# Patient Record
Sex: Female | Born: 2000 | Race: Black or African American | Hispanic: No | Marital: Single | Smoking: Current every day smoker
Health system: Southern US, Community
[De-identification: ages and names within clinical notes are randomized; demographics above are authoritative.]

## PROBLEM LIST (undated history)

## (undated) HISTORY — PX: WISDOM TOOTH EXTRACTION: SHX21

## (undated) HISTORY — PX: NO PAST SURGERIES: SHX2092

---
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
## 2019-04-03 DIAGNOSIS — Z5181 Encounter for therapeutic drug level monitoring: Secondary | ICD-10-CM | POA: Diagnosis not present

## 2019-04-09 ENCOUNTER — Ambulatory Visit: Payer: Medicaid Other

## 2019-04-11 DIAGNOSIS — Z5181 Encounter for therapeutic drug level monitoring: Secondary | ICD-10-CM | POA: Diagnosis not present

## 2019-04-19 DIAGNOSIS — Z5181 Encounter for therapeutic drug level monitoring: Secondary | ICD-10-CM | POA: Diagnosis not present

## 2019-04-26 DIAGNOSIS — Z5181 Encounter for therapeutic drug level monitoring: Secondary | ICD-10-CM | POA: Diagnosis not present

## 2019-05-02 DIAGNOSIS — Z5181 Encounter for therapeutic drug level monitoring: Secondary | ICD-10-CM | POA: Diagnosis not present

## 2019-05-08 ENCOUNTER — Ambulatory Visit: Payer: Medicaid Other

## 2019-05-17 DIAGNOSIS — Z5181 Encounter for therapeutic drug level monitoring: Secondary | ICD-10-CM | POA: Diagnosis not present

## 2019-05-22 DIAGNOSIS — Z5181 Encounter for therapeutic drug level monitoring: Secondary | ICD-10-CM | POA: Diagnosis not present

## 2019-05-29 DIAGNOSIS — Z5181 Encounter for therapeutic drug level monitoring: Secondary | ICD-10-CM | POA: Diagnosis not present

## 2019-06-12 DIAGNOSIS — Z5181 Encounter for therapeutic drug level monitoring: Secondary | ICD-10-CM | POA: Diagnosis not present

## 2019-06-19 DIAGNOSIS — Z5181 Encounter for therapeutic drug level monitoring: Secondary | ICD-10-CM | POA: Diagnosis not present

## 2019-07-04 ENCOUNTER — Other Ambulatory Visit: Payer: Self-pay

## 2019-07-04 ENCOUNTER — Ambulatory Visit: Payer: Medicaid Other | Admitting: Physician Assistant

## 2019-07-04 DIAGNOSIS — Z0389 Encounter for observation for other suspected diseases and conditions ruled out: Secondary | ICD-10-CM | POA: Diagnosis not present

## 2019-07-04 DIAGNOSIS — Z113 Encounter for screening for infections with a predominantly sexual mode of transmission: Secondary | ICD-10-CM

## 2019-07-04 DIAGNOSIS — Z1388 Encounter for screening for disorder due to exposure to contaminants: Secondary | ICD-10-CM | POA: Diagnosis not present

## 2019-07-04 DIAGNOSIS — Z3009 Encounter for other general counseling and advice on contraception: Secondary | ICD-10-CM | POA: Diagnosis not present

## 2019-07-04 LAB — WET PREP FOR TRICH, YEAST, CLUE
Trichomonas Exam: NEGATIVE
Yeast Exam: NEGATIVE

## 2019-07-05 ENCOUNTER — Encounter: Payer: Self-pay | Admitting: Physician Assistant

## 2019-07-05 NOTE — Progress Notes (Signed)
  [facility] Department STI clinic/screening visit  Subjective:  Michele Nichols is a 19 y.o. female being seen today for an STI screening visit. The patient reports they do not have symptoms.  Patient reports that they do not desire a pregnancy in the next year.   They reported they are not interested in discussing contraception today.  No LMP recorded.   Patient has the following medical conditions:  There are no problems to display for this patient.   Chief Complaint  Patient presents with  . SEXUALLY TRANSMITTED DISEASE    HPI  Patient reports that she does not have any symptoms today but would like a screening.  States that she recently had a Nexplanon removed and has a history of irregular periods.  LMP 06/10/2019 and ranged from spotting to heavy bleeding for 7 days.  Denies any concerns today.  See flowsheet for further details and programmatic requirements.    The following portions of the patient's history were reviewed and updated as appropriate: allergies, current medications, past medical history, past social history, past surgical history and problem list.  Objective:  There were no vitals filed for this visit.  Physical Exam Constitutional:      General: She is not in acute distress.    Appearance: Normal appearance. She is obese.  HENT:     Head: Normocephalic and atraumatic.     Comments: No nits, lice, or hair loss. No cervical, supraclavicular or axillary adenopathy.    Mouth/Throat:     Mouth: Mucous membranes are moist.     Pharynx: Oropharynx is clear. No oropharyngeal exudate or posterior oropharyngeal erythema.  Eyes:     Conjunctiva/sclera: Conjunctivae normal.  Pulmonary:     Effort: Pulmonary effort is normal.  Abdominal:     Palpations: Abdomen is soft. There is no mass.     Tenderness: There is no abdominal tenderness. There is no guarding or rebound.  Genitourinary:    General: Normal vulva.     Rectum: Normal.     Comments:  External genitalia/pubic area without nits, lice, edema, erythema,lesions and inguinal adenopathy. Vagina with normal mucosa and discharge. Cervix without visible lesions. Uterus firm, mobile, nt, no masses, no CMT, no adnexal tenderness or fullness. Musculoskeletal:     Cervical back: Neck supple. No tenderness.  Skin:    General: Skin is warm and dry.     Findings: No bruising, erythema, lesion or rash.  Neurological:     Mental Status: She is alert and oriented to person, place, and time.  Psychiatric:        Mood and Affect: Mood normal.        Behavior: Behavior normal.        Thought Content: Thought content normal.        Judgment: Judgment normal.      Assessment and Plan:  Michele Nichols is a 19 y.o. female presenting to the [facility] Department for STI screening  1. Screening for STD (sexually transmitted disease) Patient into clinic without symptoms. Rec condoms with all sex. Await test results.  Counseled that RN will call if needs to RTC for treatment once results are back. - WET PREP FOR TRICH, YEAST, CLUE - Chlamydia/Gonorrhea [place] [facility] - HIV [place] [facility] - Syphilis Serology, [place] [facility]     No follow-ups on file.  No future appointments.  Matt Holmes, PA

## 2019-07-10 DIAGNOSIS — Z5181 Encounter for therapeutic drug level monitoring: Secondary | ICD-10-CM | POA: Diagnosis not present

## 2019-07-17 DIAGNOSIS — Z5181 Encounter for therapeutic drug level monitoring: Secondary | ICD-10-CM | POA: Diagnosis not present

## 2019-07-24 DIAGNOSIS — Z5181 Encounter for therapeutic drug level monitoring: Secondary | ICD-10-CM | POA: Diagnosis not present

## 2019-07-31 DIAGNOSIS — Z5181 Encounter for therapeutic drug level monitoring: Secondary | ICD-10-CM | POA: Diagnosis not present

## 2019-08-13 ENCOUNTER — Other Ambulatory Visit: Payer: Self-pay

## 2019-08-13 ENCOUNTER — Encounter: Payer: Self-pay | Admitting: Emergency Medicine

## 2019-08-13 DIAGNOSIS — M545 Low back pain: Secondary | ICD-10-CM | POA: Diagnosis not present

## 2019-08-13 DIAGNOSIS — M5489 Other dorsalgia: Secondary | ICD-10-CM | POA: Diagnosis not present

## 2019-08-13 DIAGNOSIS — Z5321 Procedure and treatment not carried out due to patient leaving prior to being seen by health care provider: Secondary | ICD-10-CM | POA: Insufficient documentation

## 2019-08-13 DIAGNOSIS — R52 Pain, unspecified: Secondary | ICD-10-CM | POA: Diagnosis not present
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
## 2019-08-14 ENCOUNTER — Encounter: Payer: Self-pay | Admitting: Emergency Medicine

## 2019-08-14 ENCOUNTER — Emergency Department: Payer: No Typology Code available for payment source

## 2019-08-14 ENCOUNTER — Emergency Department
Admission: EM | Admit: 2019-08-14 | Discharge: 2019-08-14 | Discharge status: Against Medical Advice | Payer: No Typology Code available for payment source | Attending: Emergency Medicine | Admitting: Emergency Medicine

## 2019-08-14 ENCOUNTER — Emergency Department
Admission: EM | Admit: 2019-08-14 | Discharge: 2019-08-14 | Disposition: A | Discharge status: Home / Self Care | Payer: No Typology Code available for payment source | Attending: Emergency Medicine | Admitting: Emergency Medicine

## 2019-08-14 DIAGNOSIS — S39012A Strain of muscle, fascia and tendon of lower back, initial encounter: Secondary | ICD-10-CM | POA: Diagnosis not present

## 2019-08-14 DIAGNOSIS — Y939 Activity, unspecified: Secondary | ICD-10-CM | POA: Insufficient documentation

## 2019-08-14 DIAGNOSIS — Y92410 Unspecified street and highway as the place of occurrence of the external cause: Secondary | ICD-10-CM | POA: Diagnosis not present

## 2019-08-14 DIAGNOSIS — F17200 Nicotine dependence, unspecified, uncomplicated: Secondary | ICD-10-CM | POA: Diagnosis not present

## 2019-08-14 DIAGNOSIS — S3992XA Unspecified injury of lower back, initial encounter: Secondary | ICD-10-CM | POA: Diagnosis present

## 2019-08-14 DIAGNOSIS — M545 Low back pain: Secondary | ICD-10-CM | POA: Diagnosis not present

## 2019-08-14 DIAGNOSIS — Y999 Unspecified external cause status: Secondary | ICD-10-CM | POA: Diagnosis not present

## 2019-08-14 DIAGNOSIS — Z5181 Encounter for therapeutic drug level monitoring: Secondary | ICD-10-CM | POA: Diagnosis not present

## 2019-08-14 LAB — POCT PREGNANCY, URINE: Preg Test, Ur: NEGATIVE

## 2019-08-14 MED ORDER — NAPROXEN 500 MG PO TABS: 500.0000 mg | ORAL_TABLET | Freq: Two times a day (BID) | ORAL | 0 refills | Status: DC

## 2019-08-14 NOTE — ED Notes (Signed)
No answer when called several times from lobby 
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
## 2019-08-14 NOTE — Discharge Instructions (Addendum)
Follow-up with your primary care provider or [facility] if any continued problems.  Begin taking naproxen 500 mg twice daily with food.  You may use ice or heat to your back as needed for discomfort.  You will be sore for approximately 4 to 5 days.  The best thing is to keep moving to avoid stiffness and soreness.
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
## 2019-08-21 DIAGNOSIS — Z5181 Encounter for therapeutic drug level monitoring: Secondary | ICD-10-CM | POA: Diagnosis not present

## 2019-08-27 DIAGNOSIS — Z5181 Encounter for therapeutic drug level monitoring: Secondary | ICD-10-CM | POA: Diagnosis not present

## 2019-09-04 DIAGNOSIS — Z5181 Encounter for therapeutic drug level monitoring: Secondary | ICD-10-CM | POA: Diagnosis not present

## 2019-09-11 DIAGNOSIS — Z5181 Encounter for therapeutic drug level monitoring: Secondary | ICD-10-CM | POA: Diagnosis not present

## 2019-09-24 DIAGNOSIS — Z5181 Encounter for therapeutic drug level monitoring: Secondary | ICD-10-CM | POA: Diagnosis not present

## 2019-09-27 ENCOUNTER — Ambulatory Visit: Payer: Medicaid Other

## 2019-10-01 DIAGNOSIS — Z5181 Encounter for therapeutic drug level monitoring: Secondary | ICD-10-CM | POA: Diagnosis not present

## 2019-10-18 DIAGNOSIS — Z20828 Contact with and (suspected) exposure to other viral communicable diseases: Secondary | ICD-10-CM | POA: Diagnosis not present

## 2019-10-18 DIAGNOSIS — U071 COVID-19: Secondary | ICD-10-CM | POA: Diagnosis not present

## 2019-10-23 DIAGNOSIS — U071 COVID-19: Secondary | ICD-10-CM | POA: Diagnosis not present

## 2019-10-23 DIAGNOSIS — Z20828 Contact with and (suspected) exposure to other viral communicable diseases: Secondary | ICD-10-CM | POA: Diagnosis not present

## 2019-11-20 ENCOUNTER — Ambulatory Visit: Payer: Medicaid Other

## 2019-12-06 DIAGNOSIS — Z419 Encounter for procedure for purposes other than remedying health state, unspecified: Secondary | ICD-10-CM | POA: Diagnosis not present

## 2019-12-07 ENCOUNTER — Ambulatory Visit: Payer: Medicaid Other

## 2019-12-24 ENCOUNTER — Ambulatory Visit: Payer: Medicaid Other

## 2019-12-26 ENCOUNTER — Ambulatory Visit: Payer: Medicaid Other

## 2020-01-06 DIAGNOSIS — Z419 Encounter for procedure for purposes other than remedying health state, unspecified: Secondary | ICD-10-CM | POA: Diagnosis not present

## 2020-01-30 ENCOUNTER — Ambulatory Visit: Payer: Medicaid Other

## 2020-02-06 DIAGNOSIS — Z419 Encounter for procedure for purposes other than remedying health state, unspecified: Secondary | ICD-10-CM | POA: Diagnosis not present

## 2020-03-07 DIAGNOSIS — Z419 Encounter for procedure for purposes other than remedying health state, unspecified: Secondary | ICD-10-CM | POA: Diagnosis not present

## 2020-03-12 ENCOUNTER — Ambulatory Visit: Payer: Medicaid Other

## 2020-03-20 ENCOUNTER — Encounter: Payer: Self-pay | Admitting: Physician Assistant

## 2020-03-20 ENCOUNTER — Other Ambulatory Visit: Payer: Self-pay

## 2020-03-20 ENCOUNTER — Ambulatory Visit (LOCAL_COMMUNITY_HEALTH_CENTER): Payer: Medicaid Other | Admitting: Physician Assistant

## 2020-03-20 VITALS — BP 118/64 | Ht 63.0 in | Wt 264.6 lb

## 2020-03-20 DIAGNOSIS — Z01419 Encounter for gynecological examination (general) (routine) without abnormal findings: Secondary | ICD-10-CM | POA: Diagnosis not present

## 2020-03-20 DIAGNOSIS — Z3009 Encounter for other general counseling and advice on contraception: Secondary | ICD-10-CM

## 2020-03-20 LAB — WET PREP FOR TRICH, YEAST, CLUE
Trichomonas Exam: NEGATIVE
Yeast Exam: NEGATIVE

## 2020-03-20 LAB — PREGNANCY, URINE: Preg Test, Ur: NEGATIVE

## 2020-03-20 NOTE — Progress Notes (Signed)
Here today for a PE, STD screen and PT. Last PE here was 11/07/2017. Per centricity CBE due 2021, Pap Smear due 2022. No interested in birth control. Wants all STD screening today. Tawny Hopping, RN

## 2020-03-20 NOTE — Progress Notes (Signed)
PT and [employer] results reviewed. PT negative and no treatment indicated per standing orders. Tawny Hopping, RN

## 2020-03-20 NOTE — Progress Notes (Signed)
Family Planning Visit- Repeat Yearly Visit  Subjective:  Michele Nichols is a 19 y.o. G0P0000  being seen today for an well woman visit and to discuss family planning options.    She is currently using None for pregnancy prevention. Patient reports she does not want a pregnancy in the next year. Patient  does not have a problem list on file.  Chief Complaint  Patient presents with  . Annual Exam  . SEXUALLY TRANSMITTED DISEASE    Screening    Patient reports she feels well.  Patient denies any concerns, except to have pregnancy test and STI testing with this annual well-woman exam.   See flowsheet for other program required questions.   Body mass index is 46.87 kg/m. - Patient is eligible for diabetes screening based on BMI and age >95?  no HA1C ordered? not applicable  Patient reports 1 of partners in last year. Desires STI screening?  Yes   Has patient been screened once for HCV in the past?  No  No results found for: HCVAB  Does the patient have current of drug use, have a partner with drug use, and/or has been incarcerated since last result? No  If yes-- Screen for HCV through [facility]   Does the patient meet criteria for HBV testing? No  Criteria:  -Household, sexual or needle sharing contact with HBV -History of drug use -HIV positive -Those with known Hep C   Health Maintenance Due  Topic Date Due  . Hepatitis C Screening  Never done  . COVID-19 Vaccine (1) Never done  . CHLAMYDIA SCREENING  Never done  . TETANUS/TDAP  Never done  . INFLUENZA VACCINE  Never done    Review of Systems  Constitutional: Negative.   HENT: Negative.   Eyes: Negative.   Respiratory: Negative.   Cardiovascular: Negative.   Gastrointestinal: Negative.   Genitourinary:       Irregular/infrequent menses, last 2 mo ago  Musculoskeletal: Negative.   Skin: Negative.   Neurological: Negative.   Endo/Heme/Allergies: Negative.   Psychiatric/Behavioral: Negative.     The  following portions of the patient's history were reviewed and updated as appropriate: allergies, current medications, past family history, past medical history, past social history, past surgical history and problem list. Problem list updated.  Objective:   Vitals:   03/20/20 1602  BP: 118/64  Weight: 264 lb 9.6 oz (120 kg)  Height: 5\' 3"  (1.6 m)    Physical Exam Exam conducted with a chaperone present.  Constitutional:      Appearance: She is obese.  HENT:     Head: Normocephalic and atraumatic.  Cardiovascular:     Rate and Rhythm: Normal rate and regular rhythm.     Pulses: Normal pulses.     Heart sounds: Normal heart sounds.  Pulmonary:     Effort: Pulmonary effort is normal.     Breath sounds: Normal breath sounds.  Genitourinary:    General: Normal vulva.     Exam position: Lithotomy position.     Pubic Area: No rash or pubic lice.      Labia:        Right: No tenderness or lesion.        Left: No tenderness or lesion.      Vagina: Normal.     Cervix: Normal.     Uterus: Normal. Not tender.      Adnexa:        Right: No tenderness.  Left: No tenderness.       Rectum: No external hemorrhoid.     Comments: Vag pH < 4.5 Musculoskeletal:        General: Normal range of motion.     Cervical back: Normal range of motion and neck supple.     Right lower leg: No edema.     Left lower leg: No edema.  Lymphadenopathy:     Cervical: No cervical adenopathy.     Lower Body: No right inguinal adenopathy. No left inguinal adenopathy.  Skin:    General: Skin is warm and dry.     Comments: Tattoos noted  Neurological:     General: No focal deficit present.     Mental Status: She is alert and oriented to person, place, and time.  Psychiatric:        Mood and Affect: Mood normal.        Behavior: Behavior normal.        Thought Content: Thought content normal.       Assessment and Plan:  Michele Nichols is a 19 y.o. female G0P0000 presenting to the [facility] Department for an yearly well woman exam/family planning visit  Contraception counseling: Reviewed all forms of birth control options in the tiered based approach. available including abstinence; over the counter/barrier methods; hormonal contraceptive medication including pill, patch, ring, injection,contraceptive implant, ECP; hormonal and nonhormonal IUDs; permanent sterilization options including vasectomy and the various tubal sterilization modalities. Risks, benefits, and typical effectiveness rates were reviewed.  Questions were answered.  Written information was also given to the patient to review.  Patient desires no contraceptive method. She will follow up in  12 mo for surveillance.  She was told to call with any further questions, or with any concerns about this method of contraception.  Emphasized use of condoms 100% of the time for STI prevention.  Patient was offered ECP. ECP was not accepted by the patient. ECP counseling was given - see RN documentation    1. Family planning services UPT = neg. Wet prep = WNL. Pt declines BCM. Enc to await all test results and take MVI with folic acid daily.  - Pregnancy, urine - HIV/HCV [place] [facility] - Syphilis Serology, [place] [facility] - WET PREP FOR TRICH, YEAST, CLUE - Chlamydia/Gonorrhea  Lab - Gonococcus culture  2. Well woman exam with routine gynecological exam Pap due 2022.     Return in about 1 year (around 03/20/2021) for Annual well-woman exam.  No future appointments.  Landry Dyke, PA-C

## 2020-03-25 LAB — GONOCOCCUS CULTURE

## 2020-03-26 LAB — HM HIV SCREENING LAB: HM HIV Screening: NEGATIVE

## 2020-03-26 LAB — HM HEPATITIS C SCREENING LAB: HM Hepatitis Screen: NEGATIVE

## 2020-04-07 DIAGNOSIS — Z419 Encounter for procedure for purposes other than remedying health state, unspecified: Secondary | ICD-10-CM | POA: Diagnosis not present

## 2020-04-09 ENCOUNTER — Encounter: Payer: Self-pay | Admitting: Emergency Medicine

## 2020-04-09 ENCOUNTER — Other Ambulatory Visit: Payer: Self-pay

## 2020-04-09 ENCOUNTER — Emergency Department
Admission: EM | Admit: 2020-04-09 | Discharge: 2020-04-09 | Disposition: A | Discharge status: Home / Self Care | Payer: Medicaid Other | Attending: Emergency Medicine | Admitting: Emergency Medicine

## 2020-04-09 ENCOUNTER — Emergency Department: Payer: Medicaid Other

## 2020-04-09 DIAGNOSIS — S39012A Strain of muscle, fascia and tendon of lower back, initial encounter: Secondary | ICD-10-CM | POA: Diagnosis not present

## 2020-04-09 DIAGNOSIS — F17210 Nicotine dependence, cigarettes, uncomplicated: Secondary | ICD-10-CM | POA: Insufficient documentation

## 2020-04-09 DIAGNOSIS — M545 Low back pain, unspecified: Secondary | ICD-10-CM | POA: Diagnosis present

## 2020-04-09 LAB — POC URINE PREG, ED: Preg Test, Ur: NEGATIVE

## 2020-04-09 MED ORDER — ACETAMINOPHEN 500 MG PO TABS
1000.0000 mg | ORAL_TABLET | Freq: Once | ORAL | Status: AC
  Administered 2020-04-09: 1000 mg via ORAL
  Filled 2020-04-09: qty 2

## 2020-04-09 MED ORDER — IBUPROFEN 600 MG PO TABS
600.0000 mg | ORAL_TABLET | Freq: Once | ORAL | Status: AC
  Administered 2020-04-09: 600 mg via ORAL
  Filled 2020-04-09: qty 1
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
## 2020-04-09 NOTE — Discharge Instructions (Signed)
Please take Tylenol and ibuprofen/Advil for your pain.  It is safe to take them together, or to alternate them every few hours.  Take up to 1000mg  of Tylenol at a time, up to 4 times per day.  Do not take more than 4000 mg of Tylenol in 24 hours.  For ibuprofen, take 400-600 mg, 4-5 times per day.  Return to the ED with any uncontrollable pain, fevers with your symptoms, inability to go to the bathroom with your symptoms
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
## 2020-04-10 ENCOUNTER — Telehealth: Payer: Self-pay | Admitting: *Deleted

## 2020-04-10 NOTE — Telephone Encounter (Signed)
Called patient to assist with follow up appoitment ,no answer.Left message to call back the TCC for further assistance . Bethany Cumming PEC [phone]

## 2020-04-15 ENCOUNTER — Other Ambulatory Visit: Payer: Self-pay

## 2020-04-15 ENCOUNTER — Encounter: Payer: Self-pay | Admitting: *Deleted

## 2020-04-15 ENCOUNTER — Emergency Department
Admission: EM | Admit: 2020-04-15 | Discharge: 2020-04-15 | Disposition: A | Discharge status: Home / Self Care | Payer: No Typology Code available for payment source | Attending: Emergency Medicine | Admitting: Emergency Medicine

## 2020-04-15 DIAGNOSIS — M546 Pain in thoracic spine: Secondary | ICD-10-CM | POA: Insufficient documentation

## 2020-04-15 DIAGNOSIS — F17200 Nicotine dependence, unspecified, uncomplicated: Secondary | ICD-10-CM | POA: Diagnosis not present

## 2020-04-15 MED ORDER — CYCLOBENZAPRINE HCL 10 MG PO TABS: 5.0000 mg | ORAL_TABLET | Freq: Three times a day (TID) | ORAL | 0 refills | Status: DC | PRN

## 2020-04-15 MED ORDER — MELOXICAM 15 MG PO TABS: 15.0000 mg | ORAL_TABLET | Freq: Every day | ORAL | 0 refills | Status: DC

## 2020-04-15 NOTE — Discharge Instructions (Signed)
Follow up with orthopedics if not improving over the week.  Return to the ER for symptoms that change or worsen if unable to schedule an appointment.
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
## 2020-05-07 DIAGNOSIS — Z419 Encounter for procedure for purposes other than remedying health state, unspecified: Secondary | ICD-10-CM | POA: Diagnosis not present

## 2020-06-07 DIAGNOSIS — Z419 Encounter for procedure for purposes other than remedying health state, unspecified: Secondary | ICD-10-CM | POA: Diagnosis not present

## 2020-07-08 DIAGNOSIS — Z419 Encounter for procedure for purposes other than remedying health state, unspecified: Secondary | ICD-10-CM | POA: Diagnosis not present

## 2020-08-05 DIAGNOSIS — Z419 Encounter for procedure for purposes other than remedying health state, unspecified: Secondary | ICD-10-CM | POA: Diagnosis not present

## 2020-08-27 ENCOUNTER — Ambulatory Visit: Payer: Medicaid Other

## 2020-09-05 DIAGNOSIS — Z419 Encounter for procedure for purposes other than remedying health state, unspecified: Secondary | ICD-10-CM | POA: Diagnosis not present

## 2020-09-16 DIAGNOSIS — Z5181 Encounter for therapeutic drug level monitoring: Secondary | ICD-10-CM | POA: Diagnosis not present

## 2020-09-17 ENCOUNTER — Ambulatory Visit: Payer: Medicaid Other

## 2020-09-25 ENCOUNTER — Other Ambulatory Visit: Payer: Self-pay

## 2020-09-25 ENCOUNTER — Emergency Department
Admission: EM | Admit: 2020-09-25 | Discharge: 2020-09-25 | Disposition: A | Discharge status: Home / Self Care | Payer: Medicaid Other | Attending: Emergency Medicine | Admitting: Emergency Medicine

## 2020-09-25 DIAGNOSIS — G44309 Post-traumatic headache, unspecified, not intractable: Secondary | ICD-10-CM | POA: Diagnosis not present

## 2020-09-25 DIAGNOSIS — Y92410 Unspecified street and highway as the place of occurrence of the external cause: Secondary | ICD-10-CM | POA: Insufficient documentation

## 2020-09-25 DIAGNOSIS — R519 Headache, unspecified: Secondary | ICD-10-CM | POA: Diagnosis present

## 2020-09-25 DIAGNOSIS — M7918 Myalgia, other site: Secondary | ICD-10-CM

## 2020-09-25 DIAGNOSIS — F17200 Nicotine dependence, unspecified, uncomplicated: Secondary | ICD-10-CM | POA: Diagnosis not present

## 2020-09-25 DIAGNOSIS — M791 Myalgia, unspecified site: Secondary | ICD-10-CM | POA: Diagnosis not present
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
## 2020-09-26 ENCOUNTER — Telehealth: Payer: Self-pay
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
## 2020-10-05 DIAGNOSIS — Z419 Encounter for procedure for purposes other than remedying health state, unspecified: Secondary | ICD-10-CM | POA: Diagnosis not present

## 2020-10-27 ENCOUNTER — Ambulatory Visit (LOCAL_COMMUNITY_HEALTH_CENTER): Payer: Medicaid Other | Admitting: Physician Assistant

## 2020-10-27 ENCOUNTER — Encounter: Payer: Self-pay | Admitting: Physician Assistant

## 2020-10-27 ENCOUNTER — Other Ambulatory Visit: Payer: Self-pay

## 2020-10-27 VITALS — BP 120/80 | Ht 62.0 in | Wt 261.0 lb

## 2020-10-27 DIAGNOSIS — Z3009 Encounter for other general counseling and advice on contraception: Secondary | ICD-10-CM | POA: Diagnosis not present

## 2020-10-27 DIAGNOSIS — Z113 Encounter for screening for infections with a predominantly sexual mode of transmission: Secondary | ICD-10-CM

## 2020-10-27 LAB — PREGNANCY, URINE: Preg Test, Ur: NEGATIVE

## 2020-10-27 LAB — WET PREP FOR TRICH, YEAST, CLUE
Trichomonas Exam: NEGATIVE
Yeast Exam: NEGATIVE

## 2020-10-27 NOTE — Progress Notes (Signed)
Wet mount reviewed, no tx per provider orders. Provider orders completed. 
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
## 2020-10-28 DIAGNOSIS — Z5181 Encounter for therapeutic drug level monitoring: Secondary | ICD-10-CM | POA: Diagnosis not present

## 2020-10-29 NOTE — Progress Notes (Signed)
[facility] problem visit  [facility]  Subjective:  BHUMI GODBEY is a 20 y.o. being seen today requesting a pregnancy test and STD screening.  Chief Complaint  Patient presents with  . SEXUALLY TRANSMITTED DISEASE    Screening for STD including bloodwork  . Possible Pregnancy    Desires UPT    HPI  Patient states that she would like a pregnancy to occur.  Requesting a pregnancy test and STD screening today.  States that she nor her partner are having any symptoms but she wants to be sure that she is OK.  States that she has had some irregular bleeding with her periods since she had a Nexplanon removed about 2 years ago.  Denies current vitamin use and states that she is working on quitting smoking.    Does the patient have a current or past history of drug use? No   No components found for: HCV]   Health Maintenance Due  Topic Date Due  . COVID-19 Vaccine (1) Never done  . HPV VACCINES (1 - 2-dose series) Never done  . CHLAMYDIA SCREENING  Never done  . TETANUS/TDAP  Never done    Review of Systems  All other systems reviewed and are negative.   The following portions of the patient's history were reviewed and updated as appropriate: allergies, current medications, past family history, past medical history, past social history, past surgical history and problem list. Problem list updated.   See flowsheet for other program required questions.  Objective:   Vitals:   10/27/20 1523  BP: 120/80  Weight: 261 lb (118.4 kg)  Height: 5\' 2"  (1.575 m)    Physical Exam Vitals and nursing note reviewed.  Constitutional:      General: She is not in acute distress.    Appearance: Normal appearance.  HENT:     Head: Normocephalic and atraumatic.     Mouth/Throat:     Mouth: Mucous membranes are moist.     Pharynx: Oropharynx is clear. No oropharyngeal exudate or posterior oropharyngeal erythema.  Eyes:     Conjunctiva/sclera:  Conjunctivae normal.  Pulmonary:     Effort: Pulmonary effort is normal.  Abdominal:     Palpations: Abdomen is soft. There is no mass.     Tenderness: There is no abdominal tenderness. There is no guarding or rebound.  Genitourinary:    General: Normal vulva.     Rectum: Normal.     Comments: External genitalia/pubic area without nits, lice, edema, erythema, lesions and inguinal adenopathy. Vagina with normal mucosa and discharge. Cervix without visible lesions. Uterus firm, mobile, nt, no masses, no CMT, no adnexal tenderness or fullness. Musculoskeletal:     Cervical back: Neck supple. No tenderness.  Skin:    General: Skin is warm and dry.     Findings: No bruising, erythema, lesion or rash.  Neurological:     Mental Status: She is alert and oriented to person, place, and time.  Psychiatric:        Mood and Affect: Mood normal.        Behavior: Behavior normal.        Thought Content: Thought content normal.        Judgment: Judgment normal.       Assessment and Plan:  OVEDA DADAMO is a 20 y.o. female presenting to the [facility] Department for a Women's Health problem visit  1. Encounter for counseling regarding contraception Counseled patient on healthy  habits for self and partner to achieve pregnancy and have healthy pregnancy and baby. Enc patient to start/continue with regular MVI 1 po daily. RTC if changes mind about timing of pregnancy to discuss options until ready for a pregnancy.  - Pregnancy, urine  2. Screening for STD (sexually transmitted disease) Await test results.  Counseled that RN will call if needs to RTC for treatment once results are back.  - WET PREP FOR TRICH, YEAST, CLUE - Chlamydia/Gonorrhea [place] [facility] - HIV [place] [facility] - Syphilis Serology, [facility]     No follow-ups on file.  No future appointments.  Matt Holmes, PA

## 2020-10-31 LAB — HM HIV SCREENING LAB: HM HIV Screening: NEGATIVE

## 2020-11-05 DIAGNOSIS — Z419 Encounter for procedure for purposes other than remedying health state, unspecified: Secondary | ICD-10-CM | POA: Diagnosis not present

## 2020-11-18 DIAGNOSIS — Z5181 Encounter for therapeutic drug level monitoring: Secondary | ICD-10-CM | POA: Diagnosis not present

## 2020-12-05 DIAGNOSIS — Z419 Encounter for procedure for purposes other than remedying health state, unspecified: Secondary | ICD-10-CM | POA: Diagnosis not present

## 2021-01-05 DIAGNOSIS — Z419 Encounter for procedure for purposes other than remedying health state, unspecified: Secondary | ICD-10-CM | POA: Diagnosis not present

## 2021-01-29 ENCOUNTER — Other Ambulatory Visit: Payer: Self-pay

## 2021-01-29 ENCOUNTER — Encounter: Payer: Self-pay | Admitting: Emergency Medicine

## 2021-01-29 ENCOUNTER — Emergency Department
Admission: EM | Admit: 2021-01-29 | Discharge: 2021-01-29 | Disposition: A | Discharge status: Home / Self Care | Payer: Medicaid Other | Attending: Student in an Organized Health Care Education/Training Program | Admitting: Student in an Organized Health Care Education/Training Program

## 2021-01-29 DIAGNOSIS — F17200 Nicotine dependence, unspecified, uncomplicated: Secondary | ICD-10-CM | POA: Insufficient documentation

## 2021-01-29 DIAGNOSIS — K0889 Other specified disorders of teeth and supporting structures: Secondary | ICD-10-CM | POA: Diagnosis not present

## 2021-01-29 MED ORDER — KETOROLAC TROMETHAMINE 30 MG/ML IJ SOLN
30.0000 mg | Freq: Once | INTRAMUSCULAR | Status: AC
  Administered 2021-01-29: 30 mg via INTRAMUSCULAR
  Filled 2021-01-29: qty 1

## 2021-01-29 MED ORDER — AMOXICILLIN 500 MG PO CAPS: 500.0000 mg | ORAL_CAPSULE | Freq: Three times a day (TID) | ORAL | 0 refills | Status: AC

## 2021-01-29 MED ORDER — OXYCODONE-ACETAMINOPHEN 5-325 MG PO TABS
1.0000 | ORAL_TABLET | Freq: Once | ORAL | Status: AC
  Administered 2021-01-29: 1 via ORAL
  Filled 2021-01-29 (×2): qty 1

## 2021-01-29 MED ORDER — AMOXICILLIN 500 MG PO CAPS
500.0000 mg | ORAL_CAPSULE | Freq: Once | ORAL | Status: AC
  Administered 2021-01-29: 500 mg via ORAL
  Filled 2021-01-29: qty 1

## 2021-01-29 MED ORDER — HYDROCODONE-ACETAMINOPHEN 5-325 MG PO TABS: 1.0000 | ORAL_TABLET | ORAL | 0 refills | Status: DC | PRN

## 2021-01-29 NOTE — ED Provider Notes (Signed)
[facility] Emergency Department Provider Note    Event Date/Time   First MD Initiated Contact with Patient 01/29/21 1216     (approximate)  I have reviewed the triage vital signs and the nursing notes.   HISTORY  Chief Complaint Dental Pain    HPI Michele Nichols is a 20 y.o. female presents to the ER for few days of progressively worsening left-sided dental pain.  Has been taking Motrin without any improvement now describing throbbing achy pain.  Feels like she is got some fullness on the left side no measured fevers or chills.  No trouble swallowing or sore throat.  No neck pain.  Has had dental fillings in the past.  She called her dentist was not able to get in for 2 weeks.  History reviewed. No pertinent past medical history. Family History  Problem Relation Age of Onset   Migraines Mother    Diabetes Maternal Grandmother    Hypertension Maternal Grandmother    Cancer Maternal Grandmother    Kidney disease Paternal Grandmother    Past Surgical History:  Procedure Laterality Date   NO PAST SURGERIES     There are no problems to display for this patient.     Prior to Admission medications   Medication Sig Start Date End Date Taking? Authorizing Provider  amoxicillin (AMOXIL) 500 MG capsule Take 1 capsule (500 mg total) by mouth 3 (three) times daily for 7 days. 01/29/21 02/05/21 Yes Willy Eddy, MD  HYDROcodone-acetaminophen (NORCO) 5-325 MG tablet Take 1 tablet by mouth every 4 (four) hours as needed for moderate pain. 01/29/21  Yes Willy Eddy, MD  cyclobenzaprine (FLEXERIL) 10 MG tablet Take 0.5 tablets (5 mg total) by mouth 3 (three) times daily as needed for muscle spasms. Patient not taking: Reported on 10/27/2020 04/15/20   Kem Boroughs B, FNP  meloxicam (MOBIC) 15 MG tablet Take 1 tablet (15 mg total) by mouth daily. Patient not taking: Reported on 10/27/2020 04/15/20   Chinita Pester, FNP  Multiple Vitamins-Minerals (WOMENS  MULTIVITAMIN PO) Take 1 tablet by mouth daily.    [provider]    Allergies Patient has no known allergies.    Social History Social History   Tobacco Use   Smoking status: Every Day   Smokeless tobacco: Never  Vaping Use   Vaping Use: Never used  Substance Use Topics   Alcohol use: Not Currently   Drug use: Not Currently    Review of Systems Patient denies headaches, rhinorrhea, blurry vision, numbness, shortness of breath, chest pain, edema, cough, abdominal pain, nausea, vomiting, diarrhea, dysuria, fevers, rashes or hallucinations unless otherwise stated above in HPI. ____________________________________________   PHYSICAL EXAM:  VITAL SIGNS: Vitals:   01/29/21 1122  BP: (!) 148/88  Pulse: 72  Resp: 20  Temp: 98.4 F (36.9 C)  SpO2: 100%    Constitutional: Alert and oriented. Well appearing and in no acute distress. Eyes: Conjunctivae are normal.  Head: Atraumatic. Nose: No congestion/rhinnorhea. Mouth/Throat: Mucous membranes are moist.  No periapical abscess, no trismus no induration, no sublingual fullness or tenderness.  No vesicular lesions. Neck: Painless ROM.  Cardiovascular:   Good peripheral circulation. Respiratory: Normal respiratory effort.  No retractions.  Gastrointestinal: Soft and nontender.  Musculoskeletal: No lower extremity tenderness .  No joint effusions. Neurologic:  Normal speech and language. No gross focal neurologic deficits are appreciated.  Skin:  Skin is warm, dry and intact. No rash noted. Psychiatric: Mood and affect are normal. Speech  and behavior are normal.  ____________________________________________   LABS (all labs ordered are listed, but only abnormal results are displayed)  No results found for this or any previous visit (from the past 24  hour(s)). ____________________________________________ ____________________________________________  RADIOLOGY   ____________________________________________   PROCEDURES  Procedure(s) performed:  Procedures    Critical Care performed: no ____________________________________________   INITIAL IMPRESSION / ASSESSMENT AND PLAN / ED COURSE  Pertinent labs & imaging results that were available during my care of the patient were reviewed by me and considered in my medical decision making (see chart for details).   DDX: dental caries,periapical abscess, cellulitis, ludwigs  Michele Nichols is a 20 y.o. who presents to the ED with left-sided dental pain as described above.  No area of fluctuance or drainable abscess.  She is well-appearing nontoxic no trismus no posterior or pharyngeal erythema.  She does have follow-up with dentist.  Will give anti-inflammatory as well as short course of pain medication antibiotics.  Encouraged outpatient follow-up with dentist as well as discuss strict return precautions.      ____________________________________________   FINAL CLINICAL IMPRESSION(S) / ED DIAGNOSES  Final diagnoses:  Pain, dental      NEW MEDICATIONS STARTED DURING THIS VISIT:  New Prescriptions   AMOXICILLIN (AMOXIL) 500 MG CAPSULE    Take 1 capsule (500 mg total) by mouth 3 (three) times daily for 7 days.   HYDROCODONE-ACETAMINOPHEN (NORCO) 5-325 MG TABLET    Take 1 tablet by mouth every 4 (four) hours as needed for moderate pain.     Note:  This document was prepared using Dragon voice recognition software and may include unintentional dictation errors.     Willy Eddy, MD 01/29/21 [phone]

## 2021-01-29 NOTE — ED Triage Notes (Signed)
Pt presents to the ED via POV, pt c/o L sided dental pain. Unable to pinpoint if its the top or bottom. Pt states she supposed to get her wisdom teeth. Pt has tried OTC medication with no relief. Pt A&Ox4 and NAD.

## 2021-02-05 DIAGNOSIS — Z419 Encounter for procedure for purposes other than remedying health state, unspecified: Secondary | ICD-10-CM | POA: Diagnosis not present

## 2021-02-06 ENCOUNTER — Ambulatory Visit: Payer: Medicaid Other

## 2021-03-07 DIAGNOSIS — Z419 Encounter for procedure for purposes other than remedying health state, unspecified: Secondary | ICD-10-CM | POA: Diagnosis not present
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
## 2021-04-02 ENCOUNTER — Ambulatory Visit: Payer: Medicaid Other

## 2021-04-07 DIAGNOSIS — Z419 Encounter for procedure for purposes other than remedying health state, unspecified: Secondary | ICD-10-CM | POA: Diagnosis not present

## 2021-04-08 DIAGNOSIS — R109 Unspecified abdominal pain: Secondary | ICD-10-CM | POA: Diagnosis not present

## 2021-04-08 DIAGNOSIS — R112 Nausea with vomiting, unspecified: Secondary | ICD-10-CM | POA: Diagnosis not present

## 2021-04-08 DIAGNOSIS — Z3202 Encounter for pregnancy test, result negative: Secondary | ICD-10-CM | POA: Diagnosis not present

## 2021-04-08 DIAGNOSIS — R059 Cough, unspecified: Secondary | ICD-10-CM | POA: Diagnosis not present

## 2021-05-07 DIAGNOSIS — Z419 Encounter for procedure for purposes other than remedying health state, unspecified: Secondary | ICD-10-CM | POA: Diagnosis not present

## 2021-05-20 DIAGNOSIS — Z113 Encounter for screening for infections with a predominantly sexual mode of transmission: Secondary | ICD-10-CM | POA: Diagnosis not present

## 2021-06-05 ENCOUNTER — Encounter: Payer: Self-pay | Admitting: Emergency Medicine

## 2021-06-05 ENCOUNTER — Ambulatory Visit: Payer: Medicaid Other

## 2021-06-05 ENCOUNTER — Emergency Department
Admission: EM | Admit: 2021-06-05 | Discharge: 2021-06-05 | Disposition: A | Discharge status: Home / Self Care | Payer: Medicaid Other | Attending: Emergency Medicine | Admitting: Emergency Medicine

## 2021-06-05 ENCOUNTER — Other Ambulatory Visit: Payer: Self-pay

## 2021-06-05 DIAGNOSIS — M7918 Myalgia, other site: Secondary | ICD-10-CM | POA: Diagnosis not present

## 2021-06-05 DIAGNOSIS — Y92410 Unspecified street and highway as the place of occurrence of the external cause: Secondary | ICD-10-CM | POA: Diagnosis not present

## 2021-06-05 DIAGNOSIS — F17200 Nicotine dependence, unspecified, uncomplicated: Secondary | ICD-10-CM | POA: Diagnosis not present

## 2021-06-05 MED ORDER — MELOXICAM 15 MG PO TABS: 15.0000 mg | ORAL_TABLET | Freq: Every day | ORAL | 2 refills | Status: DC

## 2021-06-05 MED ORDER — BACLOFEN 10 MG PO TABS: 10.0000 mg | ORAL_TABLET | Freq: Three times a day (TID) | ORAL | 0 refills | Status: AC
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
## 2021-06-07 DIAGNOSIS — Z419 Encounter for procedure for purposes other than remedying health state, unspecified: Secondary | ICD-10-CM | POA: Diagnosis not present

## 2021-06-08 ENCOUNTER — Telehealth: Payer: Self-pay

## 2021-06-08 NOTE — Telephone Encounter (Signed)
Transition Care Management Follow-up Telephone Call Date of discharge and from where: 06/05/2021 from [facility] How have you been since you were released from the hospital? Pt stated that she is still sore but did not have any questions or concerns at this time.  Any questions or concerns? No  Items Reviewed: Did the pt receive and understand the discharge instructions provided? Yes  Medications obtained and verified? Yes  Other? No  Any new allergies since your discharge? No  Dietary orders reviewed? No Do you have support at home? Yes   Functional Questionnaire: (I = Independent and D = Dependent) ADLs: I  Bathing/Dressing- I  Meal Prep- I  Eating- I  Maintaining continence- I  Transferring/Ambulation- I  Managing Meds- I   Follow up appointments reviewed:  PCP Hospital f/u appt confirmed? No  Pt is not interested in establishing at this time.  [facility] f/u appt confirmed? No  Pt will call Ortho to schedule follow up.  Are transportation arrangements needed? No  If their condition worsens, is the pt aware to call PCP or go to the Emergency Dept.? Yes Was the patient provided with contact information for the PCP's office or ED? Yes Was to pt encouraged to call back with questions or concerns? Yes

## 2021-06-09 DIAGNOSIS — M79605 Pain in left leg: Secondary | ICD-10-CM | POA: Diagnosis not present

## 2021-06-09 DIAGNOSIS — M79604 Pain in right leg: Secondary | ICD-10-CM | POA: Diagnosis not present

## 2021-06-09 DIAGNOSIS — M79651 Pain in right thigh: Secondary | ICD-10-CM | POA: Diagnosis not present

## 2021-06-09 DIAGNOSIS — M79652 Pain in left thigh: Secondary | ICD-10-CM | POA: Diagnosis not present

## 2021-06-09 DIAGNOSIS — Z87828 Personal history of other (healed) physical injury and trauma: Secondary | ICD-10-CM | POA: Diagnosis not present

## 2021-06-09 DIAGNOSIS — H538 Other visual disturbances: Secondary | ICD-10-CM | POA: Diagnosis not present

## 2021-06-09 DIAGNOSIS — H539 Unspecified visual disturbance: Secondary | ICD-10-CM | POA: Diagnosis not present

## 2021-06-11 ENCOUNTER — Ambulatory Visit: Payer: Medicaid Other

## 2021-07-08 DIAGNOSIS — Z419 Encounter for procedure for purposes other than remedying health state, unspecified: Secondary | ICD-10-CM | POA: Diagnosis not present

## 2021-07-16 ENCOUNTER — Ambulatory Visit: Payer: Medicaid Other

## 2021-07-29 ENCOUNTER — Encounter: Payer: Self-pay | Admitting: Advanced Practice Midwife

## 2021-07-29 ENCOUNTER — Other Ambulatory Visit: Payer: Self-pay

## 2021-07-29 ENCOUNTER — Ambulatory Visit: Payer: Medicaid Other | Admitting: Advanced Practice Midwife

## 2021-07-29 ENCOUNTER — Ambulatory Visit: Payer: Medicaid Other

## 2021-07-29 DIAGNOSIS — Z113 Encounter for screening for infections with a predominantly sexual mode of transmission: Secondary | ICD-10-CM

## 2021-07-29 DIAGNOSIS — B379 Candidiasis, unspecified: Secondary | ICD-10-CM

## 2021-07-29 DIAGNOSIS — F17200 Nicotine dependence, unspecified, uncomplicated: Secondary | ICD-10-CM | POA: Insufficient documentation

## 2021-07-29 DIAGNOSIS — Z789 Other specified health status: Secondary | ICD-10-CM

## 2021-07-29 LAB — WET PREP FOR TRICH, YEAST, CLUE: Trichomonas Exam: NEGATIVE

## 2021-07-29 LAB — HM HIV SCREENING LAB: HM HIV Screening: NEGATIVE

## 2021-07-29 MED ORDER — CLOTRIMAZOLE 1 % VA CREA: 1.0000 | TOPICAL_CREAM | Freq: Every day | VAGINAL | 0 refills | Status: DC

## 2021-07-29 NOTE — Addendum Note (Signed)
Addended by: Maryan Puls on: 07/29/2021 03:30 PM   Modules accepted: Orders

## 2021-07-29 NOTE — Progress Notes (Signed)
[facility] Department  STI clinic/screening visit [address] [place] [address] [phone]  Subjective:  Michele Nichols is a 21 y.o. SBF smoker G0P0 female being seen today for an STI screening visit. The patient reports they do have symptoms.  Patient reports that they do not desire a pregnancy in the next year.   They reported they are not interested in discussing contraception today.    Patient's last menstrual period was 07/11/2021 (approximate).   Patient has the following medical conditions:   Patient Active Problem List   Diagnosis Date Noted   Morbid obesity (HCC) 260 lbs. 07/29/2021    Chief Complaint  Patient presents with   SEXUALLY TRANSMITTED DISEASE    Screening    HPI  Patient reports "it hurts when I wipe" x 1 wk. LMP 07/22/21. Last sex 07/25/21 with condom; 1 partner in last 3 mo. Smoking 1/2 ppd. Last vaped 2022.  Last ETOH yesterday (5 shots liquor) qo day.  Last HIV test per patient/review of record was 10/31/20 Patient reports last pap was never  Screening for MPX risk: Does the patient have an unexplained rash? No Is the patient MSM? No Does the patient endorse multiple sex partners or anonymous sex partners? No Did the patient have close or sexual contact with a person diagnosed with MPX? No Has the patient traveled outside the [place] where MPX is endemic? No Is there a high clinical suspicion for MPX-- evidenced by one of the following No  -Unlikely to be chickenpox  -Lymphadenopathy  -Rash that present in same phase of evolution on any given body part See flowsheet for further details and programmatic requirements.    The following portions of the patient's history were reviewed and updated as appropriate: allergies, current medications, past medical history, past social history, past surgical history and problem list.  Objective:  There were no vitals filed for this visit.  Physical Exam Vitals and nursing note  reviewed.  Constitutional:      Appearance: Normal appearance. She is obese.  HENT:     Head: Normocephalic and atraumatic.     Mouth/Throat:     Mouth: Mucous membranes are moist.     Pharynx: Oropharynx is clear. No oropharyngeal exudate or posterior oropharyngeal erythema.  Eyes:     Conjunctiva/sclera: Conjunctivae normal.  Pulmonary:     Effort: Pulmonary effort is normal.  Abdominal:     Palpations: Abdomen is soft. There is no mass.     Tenderness: There is no abdominal tenderness. There is no rebound.     Comments: Soft without masses or tenderness, increased adipose  Genitourinary:    General: Normal vulva.     Exam position: Lithotomy position.     Pubic Area: No rash or pubic lice.      Labia:        Right: No rash or lesion.        Left: No rash or lesion.      Vagina: Vaginal discharge (white creamy leukorrhea, ph<4.5) present. No erythema, bleeding or lesions.     Cervix: Normal.     Uterus: Normal.      Adnexa: Right adnexa normal and left adnexa normal.     Rectum: Normal.  Lymphadenopathy:     Head:     Right side of head: No preauricular or posterior auricular adenopathy.     Left side of head: No preauricular or posterior auricular adenopathy.     Cervical: No cervical adenopathy.  Right cervical: No superficial, deep or posterior cervical adenopathy.    Left cervical: No superficial, deep or posterior cervical adenopathy.     Upper Body:     Right upper body: No supraclavicular or axillary adenopathy.     Left upper body: No supraclavicular or axillary adenopathy.     Lower Body: No right inguinal adenopathy. No left inguinal adenopathy.  Skin:    General: Skin is warm and dry.     Findings: No rash.  Neurological:     Mental Status: She is alert and oriented to person, place, and time.     Assessment and Plan:  Michele Nichols is a 21 y.o. female presenting to the [facility] Department for STI screening  1. Morbid obesity (HCC) 260  lbs.   2. Screening examination for venereal disease Treat wet mount per standing orders Immunization nurse consult - Gonococcus culture - Chlamydia/Gonorrhea [place] [facility] - HIV [place] [facility] - Syphilis Serology, [facility] - Gonococcus culture - WET PREP FOR TRICH, YEAST, CLUE     No follow-ups on file.  No future appointments.  Alberteen Spindle, CNM

## 2021-07-29 NOTE — Progress Notes (Signed)
Patient seen today for STD testing. Condoms declined. Wet prep reviewed, medication dispensed per providers orders.

## 2021-08-03 LAB — GONOCOCCUS CULTURE

## 2021-08-05 DIAGNOSIS — Z419 Encounter for procedure for purposes other than remedying health state, unspecified: Secondary | ICD-10-CM | POA: Diagnosis not present

## 2021-09-05 DIAGNOSIS — Z419 Encounter for procedure for purposes other than remedying health state, unspecified: Secondary | ICD-10-CM | POA: Diagnosis not present

## 2021-10-05 DIAGNOSIS — Z419 Encounter for procedure for purposes other than remedying health state, unspecified: Secondary | ICD-10-CM | POA: Diagnosis not present

## 2021-10-17 ENCOUNTER — Encounter: Payer: Self-pay | Admitting: Emergency Medicine

## 2021-10-17 ENCOUNTER — Emergency Department
Admission: EM | Admit: 2021-10-17 | Discharge: 2021-10-17 | Disposition: A | Discharge status: Home / Self Care | Payer: Medicaid Other | Attending: Emergency Medicine | Admitting: Emergency Medicine

## 2021-10-17 ENCOUNTER — Other Ambulatory Visit: Payer: Self-pay

## 2021-10-17 DIAGNOSIS — B9789 Other viral agents as the cause of diseases classified elsewhere: Secondary | ICD-10-CM | POA: Diagnosis not present

## 2021-10-17 DIAGNOSIS — R0981 Nasal congestion: Secondary | ICD-10-CM | POA: Insufficient documentation

## 2021-10-17 DIAGNOSIS — R0602 Shortness of breath: Secondary | ICD-10-CM | POA: Diagnosis not present

## 2021-10-17 DIAGNOSIS — R519 Headache, unspecified: Secondary | ICD-10-CM | POA: Diagnosis not present

## 2021-10-17 DIAGNOSIS — R0989 Other specified symptoms and signs involving the circulatory and respiratory systems: Secondary | ICD-10-CM | POA: Diagnosis not present

## 2021-10-17 DIAGNOSIS — Z20822 Contact with and (suspected) exposure to covid-19: Secondary | ICD-10-CM | POA: Insufficient documentation

## 2021-10-17 DIAGNOSIS — J069 Acute upper respiratory infection, unspecified: Secondary | ICD-10-CM | POA: Diagnosis not present

## 2021-10-17 DIAGNOSIS — R059 Cough, unspecified: Secondary | ICD-10-CM | POA: Insufficient documentation

## 2021-10-17 LAB — RESP PANEL BY RT-PCR (FLU A&B, COVID) ARPGX2
Influenza A by PCR: NEGATIVE
Influenza B by PCR: NEGATIVE
SARS Coronavirus 2 by RT PCR: NEGATIVE

## 2021-10-17 LAB — POC URINE PREG, ED: Preg Test, Ur: NEGATIVE

## 2021-10-17 MED ORDER — PREDNISONE 10 MG PO TABS: ORAL_TABLET | ORAL | 0 refills | Status: DC

## 2021-10-17 NOTE — ED Provider Notes (Signed)
? ?  [facility] ?Provider Note ? ? ? Event Date/Time  ? First MD Initiated Contact with Patient 10/17/21 1320   ?  (approximate) ? ? ?History  ? ?Cough, Nasal Congestion, and Headache ? ? ?HPI ? ?Michele Nichols is a 21 y.o. female presents to the ER today with complaint of headache, runny nose, nasal congestion, cough and shortness of breath.  She reports this started 3 days ago.  The headache is located in her forehead.  She describes the pain as pressure.  She is blowing clear mucus out of her nose.  The cough is productive of mucus but she is unsure of the color.  She denies ear pain, sore throat, nausea, vomiting or diarrhea.  She denies fever, chills or body aches.  She has taken Benadryl OTC with minimal relief of symptoms.  She has had sick contacts diagnosed with COVID and would like to be tested for this today. ? ?She is also requesting a pregnancy test.  She is currently on her menstrual cycle. ? ? ?Physical Exam  ? ?Triage Vital Signs: ?ED Triage Vitals  ?Enc Vitals Group  ?   BP 10/17/21 1313 121/77  ?   Pulse Rate 10/17/21 1313 86  ?   Resp 10/17/21 1313 16  ?   Temp 10/17/21 1313 98.2 ?F (36.8 ?C)  ?   Temp Source 10/17/21 1313 Oral  ?   SpO2 10/17/21 1313 99 %  ?   Weight 10/17/21 1308 264 lb 8.8 oz (120 kg)  ?   Height 10/17/21 1308 5\' 3"  (1.6 m)  ?   Head Circumference --   ?   Peak Flow --   ?   Pain Score 10/17/21 1308 0  ?   Pain Loc --   ?   Pain Edu? --   ?   Excl. in [place]? --   ? ? ?Most recent vital signs: ?Vitals:  ? 10/17/21 1313  ?BP: 121/77  ?Pulse: 86  ?Resp: 16  ?Temp: 98.2 ?F (36.8 ?C)  ?SpO2: 99%  ? ? ? ?General: Awake, no distress.  ?Nodes:  No cervical adenopathy noted. ?CV:  RRR. ?Resp:  Normal effort.  CTA bilaterally. ? ? ? ?ED Results / Procedures / Treatments  ? ?Labs ?Labs Reviewed  ?POC URINE PREG, ED - Normal  ?RESP PANEL BY RT-PCR (FLU A&B, COVID) [id number]  ? ? ? ? ?MEDICATIONS ORDERED IN ED: ?Medications - No data to display ? ? ?IMPRESSION / MDM /  ASSESSMENT AND PLAN / ED COURSE  ?I reviewed the triage vital signs and the nursing notes. ? ?Acute headache, stuffy and runny nose, cough and shortness of breath: ? ?Differential diagnosis includes, but is not limited to, allergic rhinitis, viral URI with cough, viral bronchitis, COVID, RSV, flu ? ?COVID/flu/RSV negative ?Urine pregnancy test per her request- negative ?Discussed findings with patient, she is agreeable to RX for symptom management and discharge ?RX for Pred taper x 6 days ?No indication for abx at this time  ? ? ?FINAL CLINICAL IMPRESSION(S) / ED DIAGNOSES  ? ?Final diagnoses:  ?None  ? ? ? ?Rx / DC Orders  ? ?ED Discharge Orders   ? ? None  ? ?  ? ? ? ?Note:  This document was prepared using Dragon voice recognition software and may include unintentional dictation errors. ? ?  ?10/19/21, NP ?10/17/21 1501 ? ?  ?10/19/21, MD ?10/18/21 1553 ? ?

## 2021-10-17 NOTE — Discharge Instructions (Signed)
You were seen today for URI symptoms.  Your COVID/flu/RSV test was negative.  You are not pregnant.  I put you on steroids for symptom management and provided you with a work note. ?

## 2021-10-17 NOTE — ED Triage Notes (Signed)
Pt reports was exposed to covid by several people and now she has a HA and cough and congestion and wants to be test for covid.  ?

## 2021-11-05 DIAGNOSIS — Z419 Encounter for procedure for purposes other than remedying health state, unspecified: Secondary | ICD-10-CM | POA: Diagnosis not present

## 2021-11-25 ENCOUNTER — Ambulatory Visit: Payer: Medicaid Other

## 2021-11-27 ENCOUNTER — Telehealth: Payer: Self-pay

## 2021-11-27 NOTE — Telephone Encounter (Signed)
Attempted to call pt to make appt for PCP- number is not in service.

## 2021-12-17 ENCOUNTER — Telehealth: Payer: Self-pay

## 2021-12-17 NOTE — Telephone Encounter (Signed)
Made pt. New pt. Appointment at [facility].

## 2021-12-23 ENCOUNTER — Ambulatory Visit: Payer: Self-pay | Admitting: Advanced Practice Midwife

## 2021-12-23 ENCOUNTER — Encounter: Payer: Self-pay | Admitting: Advanced Practice Midwife

## 2021-12-23 DIAGNOSIS — Z113 Encounter for screening for infections with a predominantly sexual mode of transmission: Secondary | ICD-10-CM

## 2021-12-23 LAB — WET PREP FOR TRICH, YEAST, CLUE
Trichomonas Exam: NEGATIVE
Yeast Exam: NEGATIVE

## 2021-12-23 LAB — HM HIV SCREENING LAB: HM HIV Screening: NEGATIVE

## 2021-12-23 NOTE — Progress Notes (Signed)
Pt here for STD screening.  Wet mount results reviewed, no treatment required per SO.  Condoms declined.  Zianne Schubring M Laelle Bridgett, RN  

## 2021-12-23 NOTE — Progress Notes (Signed)
[facility] Department  STI clinic/screening visit [address] [place] [address] [phone]  Subjective:  Michele Nichols is a 21 y.o.SBF nullip smoker female being seen today for an STI screening visit. The patient reports they do not have symptoms.  Patient reports that they do not desire a pregnancy in the next year.   They reported they are not interested in discussing contraception today.    Patient's last menstrual period was 11/06/2021.   Patient has the following medical conditions:   Patient Active Problem List   Diagnosis Date Noted   Morbid obesity (HCC) 264 lbs. 07/29/2021   Smoker 1/2 ppd 07/29/2021   Alcohol use qo day 07/29/2021    Chief Complaint  Patient presents with   SEXUALLY TRANSMITTED DISEASE    Screening    HPI  Patient reports asymptomatic. LMP 11/19/21.  Last sex 12/19/21 without condom; with current partner x 3 years; 1 partner in last 3 mo. Last MJ 9 yo. Last ETOH yesterday (1 beer) qo weekend. Smoking 1 ppd, last vaped 21 yo.   Last HIV test per patient/review of record was 07/29/21 Patient reports last pap was never  Screening for MPX risk: Does the patient have an unexplained rash? No Is the patient MSM? No Does the patient endorse multiple sex partners or anonymous sex partners? No Did the patient have close or sexual contact with a person diagnosed with MPX? No Has the patient traveled outside the [place] where MPX is endemic? No Is there a high clinical suspicion for MPX-- evidenced by one of the following No  -Unlikely to be chickenpox  -Lymphadenopathy  -Rash that present in same phase of evolution on any given body part See flowsheet for further details and programmatic requirements.   Immunization history:  Immunization History  Administered Date(s) Administered   Hepatitis A 10/20/2011   Hepatitis B 06-22-1999, 01/11/2000, 07/28/2000   Hpv-Unspecified 10/20/2011   Tdap 01/26/2011     The following  portions of the patient's history were reviewed and updated as appropriate: allergies, current medications, past medical history, past social history, past surgical history and problem list.  Objective:  There were no vitals filed for this visit.  Physical Exam Vitals and nursing note reviewed.  Constitutional:      Appearance: Normal appearance. She is obese.  HENT:     Head: Normocephalic and atraumatic.     Mouth/Throat:     Mouth: Mucous membranes are moist.     Pharynx: Oropharynx is clear. No oropharyngeal exudate or posterior oropharyngeal erythema.     Tonsils: No tonsillar exudate or tonsillar abscesses.  Eyes:     Conjunctiva/sclera: Conjunctivae normal.  Pulmonary:     Effort: Pulmonary effort is normal.  Abdominal:     Palpations: Abdomen is soft. There is no mass.     Tenderness: There is no abdominal tenderness. There is no rebound.     Comments: Soft without masses or tenderness, poor tone, increased adipose  Genitourinary:    General: Normal vulva.     Exam position: Lithotomy position.     Pubic Area: No rash or pubic lice.      Labia:        Right: No rash or lesion.        Left: No rash or lesion.      Vagina: Vaginal discharge (white creamy leukorrhea, ph<4.5) present. No erythema, bleeding or lesions.     Cervix: Normal.     Uterus: Normal.  Adnexa: Right adnexa normal and left adnexa normal.     Rectum: Normal.     Comments: pH = <4.5 Lymphadenopathy:     Head:     Right side of head: No preauricular or posterior auricular adenopathy.     Left side of head: No preauricular or posterior auricular adenopathy.     Cervical: No cervical adenopathy.     Right cervical: No superficial, deep or posterior cervical adenopathy.    Left cervical: No superficial, deep or posterior cervical adenopathy.     Upper Body:     Right upper body: No supraclavicular, axillary or epitrochlear adenopathy.     Left upper body: No supraclavicular, axillary or  epitrochlear adenopathy.     Lower Body: No right inguinal adenopathy. No left inguinal adenopathy.  Skin:    General: Skin is warm and dry.     Findings: No rash.  Neurological:     Mental Status: She is alert and oriented to person, place, and time.     Assessment and Plan:  Michele Nichols is a 21 y.o. female presenting to the [facility] Department for STI screening  1. Screening examination for venereal disease Treat wet mount per standing orders Immunization nurse consult  - WET PREP FOR TRICH, YEAST, CLUE - Gonococcus culture - Syphilis Serology, [facility] - Chlamydia/Gonorrhea [place] [facility] - HIV [place] [facility] - Gonococcus culture     No follow-ups on file.  Future Appointments  Date Time Provider Department Center  03/19/2022  9:00 AM Ostwalt, Edmon Crape, PA-C [facility]    Alberteen Spindle, CNM

## 2021-12-27 LAB — GONOCOCCUS CULTURE

## 2022-03-19 ENCOUNTER — Ambulatory Visit: Payer: Self-pay | Admitting: Physician Assistant

## 2022-03-19 NOTE — Progress Notes (Deleted)
Michele Nichols,acting as a Education administrator for [employer], PA-C.,have documented all relevant documentation on the behalf of Michele Speak, PA-C,as directed by  [employer], PA-C while in the presence of [employer], PA-C.   New patient visit   Patient: Michele Nichols   DOB: [birth year]   21 y.o. Female  MRN: [id number] Visit Date: 03/19/2022  Today's healthcare provider: Mardene Speak, PA-C   No chief complaint on file.  Subjective    Michele Nichols is a 21 y.o. female who presents today as a new patient to establish care.  HPI    No past medical history on file. Past Surgical History:  Procedure Laterality Date   NO PAST SURGERIES     Family Status  Relation Name Status   Mother  (Not Specified)   MGM  (Not Specified)   PGM  (Not Specified)   Family History  Problem Relation Age of Onset   Migraines Mother    Diabetes Maternal Grandmother    Hypertension Maternal Grandmother    Cancer Maternal Grandmother    Kidney disease Paternal Grandmother    Social History   Socioeconomic History   Marital status: Single    Spouse name: Not on file   Number of children: 0   Years of education: 11   Highest education level: 11th grade  Occupational History   Not on file  Tobacco Use   Smoking status: Every Day    Types: E-cigarettes, Cigarettes   Smokeless tobacco: Never  Vaping Use   Vaping Use: Never used  Substance and Sexual Activity   Alcohol use: Not Currently    Alcohol/week: 1.0 standard drink of alcohol    Types: 1 Cans of beer per week    Comment: last use 12/22/21 qo weekend   Drug use: Not Currently    Types: Marijuana    Comment: last use age 21   Sexual activity: Yes    Partners: Male    Birth control/protection: None  Other Topics Concern   Not on file  Social History Narrative   Not on file   Social Determinants of Health   Financial Resource Strain: Not on file  Food Insecurity: Not on file  Transportation Needs: Not on file   Physical Activity: Not on file  Stress: Not on file  Social Connections: Not on file   Outpatient Medications Prior to Visit  Medication Sig   clotrimazole (CLOTRIMAZOLE-7) 1 % vaginal cream Place 1 Applicatorful vaginally at bedtime. (Patient not taking: Reported on 12/23/2021)   predniSONE (DELTASONE) 10 MG tablet Take 6 tabs on day 1, 5 tabs on day 2, 4 tabs on day 3, 3 tabs on day 4, 2 tabs on day 5, 1 tab on day 6 (Patient not taking: Reported on 12/23/2021)   No facility-administered medications prior to visit.   No Known Allergies  Immunization History  Administered Date(s) Administered   Hepatitis A 10/20/2011   Hepatitis B April 16, 2000, 01/11/2000, 07/28/2000   Hpv-Unspecified 10/20/2011   Tdap 01/26/2011    Health Maintenance  Topic Date Due   COVID-19 Vaccine (1) Never done   HPV VACCINES (2 - 2-dose series) 04/21/2012   CHLAMYDIA SCREENING  Never done   PAP-Cervical Cytology Screening  Never done   PAP SMEAR-Modifier  Never done   TETANUS/TDAP  01/25/2021   INFLUENZA VACCINE  Never done   Hepatitis C Screening  Completed   HIV Screening  Completed    Patient Care Team: Pcp, No as PCP -  General  Review of Systems  {Labs  Heme  Chem  Endocrine  Serology  Results Review (optional):23779}   Objective    There were no vitals taken for this visit. {Show previous vital signs (optional):23777}  Physical Exam ***  Depression Screen    03/20/2020    4:07 PM  PHQ 2/9 Scores  PHQ - 2 Score 0   No results found for any visits on 03/19/22.  Assessment & Plan     ***  No follow-ups on file.     {provider attestation***:1}   Michele Nichols, Hershal Coria  [facility] [phone] (phone) [phone] (fax)  [place]

## 2022-04-26 ENCOUNTER — Ambulatory Visit: Payer: Self-pay

## 2022-06-08 ENCOUNTER — Ambulatory Visit: Payer: Self-pay

## 2022-06-14 ENCOUNTER — Ambulatory Visit: Payer: Self-pay

## 2022-06-28 ENCOUNTER — Encounter: Payer: Self-pay | Admitting: Family

## 2022-06-28 ENCOUNTER — Ambulatory Visit: Payer: Self-pay | Admitting: Family

## 2022-06-28 DIAGNOSIS — Z113 Encounter for screening for infections with a predominantly sexual mode of transmission: Secondary | ICD-10-CM

## 2022-06-28 LAB — WET PREP FOR TRICH, YEAST, CLUE
Trichomonas Exam: NEGATIVE
Yeast Exam: NEGATIVE

## 2022-06-28 NOTE — Progress Notes (Unsigned)
Pt here for STI screening.  Pt is asymptomatic and desires to self collect.  Pt is unable to wait for results of wet mount.  Advised she would be contacted if any treatment needed.  Wet mount is negative and treatment is not needed.  Condoms declined.-Forest Becker, RN

## 2022-07-01 NOTE — Progress Notes (Signed)
[facility] Department  STI clinic/screening visit [place] [address] [phone]  Subjective:  Michele Nichols is a 22 y.o. female being seen today for an STI screening visit. The patient reports they do not have symptoms.  Patient reports that they do not desire a pregnancy in the next year.   They reported they are not interested in discussing contraception today.    Patient's last menstrual period was 06/23/2022 (exact date).  Patient has the following medical conditions:   Patient Active Problem List   Diagnosis Date Noted   Morbid obesity ([place]) 264 lbs. 07/29/2021   Smoker 1/2 ppd-1 ppd 07/29/2021   Alcohol use qo day 07/29/2021    Chief Complaint  Patient presents with   SEXUALLY TRANSMITTED DISEASE    Screening    HPI  Patient reports asymptomatic, just desires vaginal testing, no bloodwork today.  Does the patient using douching products? No  Last HIV test per patient/review of record was  Lab Results  Component Value Date   HMHIVSCREEN Negative - Validated 12/23/2021   No results found for: "HIV" Patient reports last pap was No results found for: "DIAGPAP"   Screening for MPX risk: Does the patient have an unexplained rash? No Is the patient MSM? No Does the patient endorse multiple sex partners or anonymous sex partners? No Did the patient have close or sexual contact with a person diagnosed with MPX? No Has the patient traveled outside the [place] where MPX is endemic? No Is there a high clinical suspicion for MPX-- evidenced by one of the following No  -Unlikely to be chickenpox  -Lymphadenopathy  -Rash that present in same phase of evolution on any given body part See flowsheet for further details and programmatic requirements.   Immunization history:  Immunization History  Administered Date(s) Administered   Hepatitis A 10/20/2011   Hepatitis B 07-13-99, 01/11/2000, 07/28/2000   Hpv-Unspecified 10/20/2011    MMR 06/05/2002, 12/25/2003   Meningococcal Mcv4o 10/20/2011   Pneumococcal Conjugate PCV 7 12/25/2003   Tdap 01/26/2011   Varicella 06/05/2002, 10/20/2011     The following portions of the patient's history were reviewed and updated as appropriate: allergies, current medications, past medical history, past social history, past surgical history and problem list.  Objective:  There were no vitals filed for this visit.  Physical Exam PATIENT DECLINED STI EXAMINATION, DESIRES TO SELF-COLLECT SAMPLES FOR TESTING.  Assessment and Plan:  Michele Nichols is a 22 y.o. female presenting to the [facility] Department for STI screening  1. Screening for venereal disease Will contact if positive results Use condoms for all sex and contraception. RTC if desires bloodwork for HIV and RPR  - [place], CLUE   Patient accepted all screenings including vaginal CT/GC and wet prep. Patient meets criteria for HepB screening? No. Ordered? not applicable Patient meets criteria for HepC screening? No. Ordered? not applicable  Treat wet prep per standing order Discussed time line for [facility] results and that patient will be called with positive results and encouraged patient to call if she had not heard in 2 weeks.  Counseled to return or seek care for continued or worsening symptoms Recommended condom use with all sex  Patient is currently using NOTHING to prevent pregnancy.    Return if symptoms worsen or fail to improve.  No future appointments.  Marline Backbone, FNP

## 2022-08-18 ENCOUNTER — Other Ambulatory Visit: Payer: Self-pay | Admitting: Advanced Practice Midwife

## 2022-08-18 DIAGNOSIS — B379 Candidiasis, unspecified: Secondary | ICD-10-CM

## 2022-08-18 NOTE — Telephone Encounter (Signed)
Pt stated that this "popped up on My Chart" and just put in for a refill.  Pt notified that if this medication is needed, she can get it OTC or come in for an appointment.  Windle Guard, RN

## 2022-08-20 ENCOUNTER — Emergency Department: Payer: Self-pay

## 2022-08-20 ENCOUNTER — Emergency Department
Admission: EM | Admit: 2022-08-20 | Discharge: 2022-08-20 | Disposition: A | Discharge status: Home / Self Care | Payer: Self-pay | Attending: Emergency Medicine | Admitting: Emergency Medicine

## 2022-08-20 ENCOUNTER — Other Ambulatory Visit: Payer: Self-pay

## 2022-08-20 ENCOUNTER — Encounter: Payer: Self-pay | Admitting: Emergency Medicine

## 2022-08-20 DIAGNOSIS — Z87891 Personal history of nicotine dependence: Secondary | ICD-10-CM | POA: Insufficient documentation

## 2022-08-20 DIAGNOSIS — M25562 Pain in left knee: Secondary | ICD-10-CM | POA: Insufficient documentation

## 2022-08-20 NOTE — ED Triage Notes (Signed)
Patient to ED via POV for left knee pain. Pain ongoing x2 days. Unsure of injury. Been taking over the counter meds for pain with no relief. Ambulatory to triage with limp.

## 2022-08-20 NOTE — Discharge Instructions (Signed)
You may wear the Ace wrap during the day and remove it at night.  Please return for any new, worsening, or change in symptoms or other concerns.  It was a pleasure caring for you today.

## 2022-08-20 NOTE — ED Provider Notes (Signed)
[facility] Provider Note    Event Date/Time   First MD Initiated Contact with Patient 08/20/22 0740     (approximate)   History   Knee Pain   HPI  Michele Nichols is a 22 y.o. female with a past medical history of obesity who presents today for evaluation of left medial knee pain.  She reports that she noticed that when she went to get out of bed 2 days ago.  She denies specific injury.  She reports that she has pain with bending her knee.  She has not noticed any swelling, redness, warmth.  She is still able to ambulate.  She has not taken anything for her symptoms.  Patient Active Problem List   Diagnosis Date Noted   Morbid obesity ([place]) 264 lbs. 07/29/2021   Smoker 1/2 ppd-1 ppd 07/29/2021   Alcohol use qo day 07/29/2021          Physical Exam   Triage Vital Signs: ED Triage Vitals  Enc Vitals Group     BP 08/20/22 0726 (!) 129/106     Pulse Rate 08/20/22 0726 89     Resp 08/20/22 0726 18     Temp 08/20/22 0726 98.2 F (36.8 C)     Temp Source 08/20/22 0726 Oral     SpO2 08/20/22 0726 93 %     Weight --      Height --      Head Circumference --      Peak Flow --      Pain Score 08/20/22 0725 10     Pain Loc --      Pain Edu? --      Excl. in [place]? --     Most recent vital signs: Vitals:   08/20/22 0726  BP: (!) 129/106  Pulse: 89  Resp: 18  Temp: 98.2 F (36.8 C)  SpO2: 93%    Physical Exam Vitals and nursing note reviewed.  Constitutional:      General: Awake and alert. No acute distress.    Appearance: Normal appearance. The patient is overweight.  HENT:     Head: Normocephalic and atraumatic.     Mouth: Mucous membranes are moist.  Eyes:     General: PERRL. Normal EOMs        Right eye: No discharge.        Left eye: No discharge.     Conjunctiva/sclera: Conjunctivae normal.  Cardiovascular:     Rate and Rhythm: Normal rate and regular rhythm.     Pulses: Normal pulses.  Pulmonary:     Effort: Pulmonary  effort is normal. No respiratory distress.     Breath sounds: Normal breath sounds.  Abdominal:     Abdomen is soft. There is no abdominal tenderness. No rebound or guarding. No distention. Musculoskeletal:        General: No swelling. Normal range of motion.     Cervical back: Normal range of motion and neck supple.  Left knee: No deformity or rash. No joint line tenderness. No patellar tenderness, no ballotment Warm and well perfused extremity with 2+ pedal pulses 5/5 strength to dorsiflexion and plantarflexion at the ankle with intact sensation throughout extremity Normal range of motion of the knee, with intact flexion and extension to active and passive range of motion. Extensor mechanism intact. No ligamentous laxity. Negative anterior/posterior drawer/negative lachman, pain with mcmurrays No effusion or warmth Intact quadriceps, hamstring function, patellar tendon function Pelvis stable Full ROM of ankle  without pain or swelling Foot warm and well perfused Skin:    General: Skin is warm and dry.     Capillary Refill: Capillary refill takes less than 2 seconds.     Findings: No rash.  Neurological:     Mental Status: The patient is awake and alert.      ED Results / Procedures / Treatments   Labs (all labs ordered are listed, but only abnormal results are displayed) Labs Reviewed - No data to display   EKG     RADIOLOGY I independently reviewed and interpreted imaging and agree with radiologists findings.     PROCEDURES:  Critical Care performed:   Procedures   MEDICATIONS ORDERED IN ED: Medications - No data to display   IMPRESSION / MDM / [place] / ED COURSE  I reviewed the triage vital signs and the nursing notes.   Differential diagnosis includes, but is not limited to, effusion, sprain, contusion, dislocation, fracture, joint infection, tendon rupture. No evidence of neurological deficit or vascular compromise on exam. No  fracture/dislocation on X-Ray. No deformity or obvious ligamentous laxity on exam. No constitutional symptoms or effusion to suggest septic joint. No history of immunosuppression. Overall well appearing, vital signs stable. No indication for diagnostic or therapeutic procedure such as arthrocentesis.  She does have discomfort with McMurray's maneuver, possibly component of meniscal injury.  It could also be patellofemoral syndrome.  She was instructed to follow-up with orthopedics.  She was given an Ace wrap for extra support.  We discussed that she may wear this during the day and remove it at night so as to not increase her risk for developing blood clots.  We also discussed rest, ice, elevation.  Return precautions and care instructions discussed. Outpatient follow-up advised. Patient agrees with plan of care.  Patient's presentation is most consistent with acute complicated illness / injury requiring diagnostic workup.      FINAL CLINICAL IMPRESSION(S) / ED DIAGNOSES   Final diagnoses:  Acute pain of left knee     Rx / DC Orders   ED Discharge Orders     None        Note:  This document was prepared using Dragon voice recognition software and may include unintentional dictation errors.   Emeline Gins 08/20/22 1234    Carrie Mew, MD 08/20/22 [phone]

## 2022-09-02 ENCOUNTER — Emergency Department
Admission: EM | Admit: 2022-09-02 | Discharge: 2022-09-02 | Disposition: A | Discharge status: Home / Self Care | Payer: Self-pay | Attending: Emergency Medicine | Admitting: Emergency Medicine

## 2022-09-02 ENCOUNTER — Other Ambulatory Visit: Payer: Self-pay

## 2022-09-02 DIAGNOSIS — S01112A Laceration without foreign body of left eyelid and periocular area, initial encounter: Secondary | ICD-10-CM | POA: Insufficient documentation

## 2022-09-02 DIAGNOSIS — W208XXA Other cause of strike by thrown, projected or falling object, initial encounter: Secondary | ICD-10-CM | POA: Insufficient documentation

## 2022-09-02 NOTE — ED Provider Notes (Signed)
[facility] Provider Note    Event Date/Time   First MD Initiated Contact with Patient 09/02/22 0040     (approximate)   History   Laceration   HPI  Michele Nichols is a 22 y.o. female who presents for evaluation of contusion and laceration to left eyebrow.  She was getting ready for work and a close rack fell and hit her in the left eyebrow.  It did not knock her unconscious and she thought it was a fairly minimal impact until she started noticing that she was bleeding.  She believes that she is up-to-date on her tetanus vaccination.  Bleeding stopped with direct pressure and gauze.  She put some Vaseline on it at home but otherwise did not wash it out.  No other injuries.     Physical Exam   Triage Vital Signs: ED Triage Vitals  Enc Vitals Group     BP 09/02/22 0031 130/84     Pulse Rate 09/02/22 0031 61     Resp 09/02/22 0031 18     Temp 09/02/22 0031 98.1 F (36.7 C)     Temp Source 09/02/22 0031 Oral     SpO2 09/02/22 0031 100 %     Weight 09/02/22 0032 108.9 kg (240 lb)     Height 09/02/22 0032 1.6 m (5\' 3" )     Head Circumference --      Peak Flow --      Pain Score 09/02/22 0031 0     Pain Loc --      Pain Edu? --      Excl. in [place]? --     Most recent vital signs: Vitals:   09/02/22 0031  BP: 130/84  Pulse: 61  Resp: 18  Temp: 98.1 F (36.7 C)  SpO2: 100%     General: Awake, no distress.  Head/face: 0.8 cm laceration to the lateral aspect of the left eyebrow.  Minimal bleeding.  No contamination.  No other injuries are evident. CV:  Good peripheral perfusion.  Regular rate and rhythm. Resp:  Normal effort. Speaking easily and comfortably, no accessory muscle usage nor intercostal retractions.   Abd:  No distention. Other:  No other injuries are appreciated.   ED Results / Procedures / Treatments   Labs (all labs ordered are listed, but only abnormal results are displayed) Labs Reviewed - No data to  display     PROCEDURES:  Critical Care performed: No  ..Laceration Repair  Date/Time: 09/02/2022 12:10 AM  Performed by: Hinda Kehr, MD Authorized by: Hinda Kehr, MD   Consent:    Consent obtained:  Verbal   Consent given by:  Patient   Risks discussed:  Infection, poor cosmetic result and need for additional repair Universal protocol:    Patient identity confirmed:  Verbally with patient Laceration details:    Location:  Face   Face location:  L eyebrow   Length (cm):  0.8 Exploration:    Hemostasis achieved with:  Direct pressure   Wound exploration: entire depth of wound visualized     Contaminated: no   Treatment:    Area cleansed with:  Povidone-iodine and saline   Amount of cleaning:  Standard   Irrigation solution:  Sterile saline Skin repair:    Repair method:  Tissue adhesive and Steri-Strips   Number of Steri-Strips:  1 Approximation:    Approximation:  Close Repair type:    Repair type:  Simple Post-procedure details:    Dressing:  Open (no dressing)   Procedure completion:  Tolerated well, no immediate complications    MEDICATIONS ORDERED IN ED: Medications - No data to display   IMPRESSION / MDM / [place] / ED COURSE  I reviewed the triage vital signs and the nursing notes.                              Differential diagnosis includes, but is not limited to, laceration, contusion, fracture, intracranial bleed.  Patient's presentation is most consistent with acute, uncomplicated illness.   Minor laceration to left eyebrow.  I offered sutures, staples, or skin adhesive/adhesive tape, and the patient very much does not want sutures or staples.  Close approximation was achieved.  I gave my usual and customary management recommendations and return precautions.         FINAL CLINICAL IMPRESSION(S) / ED DIAGNOSES   Final diagnoses:  Laceration of left eyebrow, initial encounter     Rx / DC Orders   ED Discharge Orders      None        Note:  This document was prepared using Dragon voice recognition software and may include unintentional dictation errors.   Hinda Kehr, MD 09/02/22 [phone]

## 2022-09-02 NOTE — Discharge Instructions (Signed)
You have been seen in the Emergency Department (ED) today for a laceration (cut).  We were able to close it with skin glue and/or tape.  Please keep the wound dry for about 24 hours.  At that point you can get it wet, in the shower, for example, but do not submerge it in water.  In 1 to 2 weeks the glue and/or tape will start to come off on its own.  Please do not pull it off early, allow it to fall off on its own.  If there are edges that are starting to pull up, you can trim them with a clean pair of small scissors.  Please take Tylenol (acetaminophen) or Motrin (ibuprofen) as needed for discomfort as written on the box.   Please follow up with your doctor as soon as possible regarding today's emergent visit.   Return to the ED or call your doctor if you notice any signs of infection such as fever, increased pain, increased redness, pus, or other symptoms that concern you.

## 2022-09-02 NOTE — ED Triage Notes (Signed)
Pt to ED via POV c/o laceration to left eyebrow. Pt was getting ready for work when clothes rack fell and hit her. Pt in NAD at this time. Bleeding under control, laceration is a little less than 1 inch

## 2022-09-09 ENCOUNTER — Encounter: Payer: Self-pay | Admitting: Family

## 2022-09-09 ENCOUNTER — Ambulatory Visit: Payer: Self-pay | Admitting: Family

## 2022-09-09 DIAGNOSIS — Z113 Encounter for screening for infections with a predominantly sexual mode of transmission: Secondary | ICD-10-CM

## 2022-09-09 LAB — WET PREP FOR TRICH, YEAST, CLUE
Trichomonas Exam: NEGATIVE
Yeast Exam: NEGATIVE

## 2022-09-09 LAB — HM HIV SCREENING LAB: HM HIV Screening: NEGATIVE

## 2022-09-09 NOTE — Progress Notes (Signed)
Pt appointment for STI Screening. Seen by Damar. Pt declined to wait for wet prep results. Stated she would check them on her mychart, and left after getting bloodwork.

## 2022-09-09 NOTE — Progress Notes (Signed)
[facility] Department  STI clinic/screening visit [address] [place] [address] [phone]  Subjective:  Michele Nichols is a 22 y.o. female being seen today for an STI screening visit. The patient reports they do not have symptoms.  Patient reports that they do not desire a pregnancy in the next year.   They reported they are not interested in discussing contraception today.    Patient's last menstrual period was 07/24/2022 (approximate).  Patient has the following medical conditions:   Patient Active Problem List   Diagnosis Date Noted   Morbid obesity (HCC) 264 lbs. 07/29/2021   Smoker 1/2 ppd-1 ppd 07/29/2021   Alcohol use qo day 07/29/2021    Chief Complaint  Patient presents with   SEXUALLY TRANSMITTED DISEASE    Complete STI screening    HPI  Patient reports here for STI screening, denies symptoms or known exposure to STIs. Unable to obtain history from patient because she is watching videos on her phone and will not turn them off.  Does the patient using douching products? no  Last HIV test per patient/review of record was  Lab Results  Component Value Date   HMHIVSCREEN Negative - Validated 12/23/2021   No results found for: "HIV" Patient reports last pap was No results found for: "DIAGPAP" No results found for: "SPECADGYN"  Screening for MPX risk: Does the patient have an unexplained rash? No Is the patient MSM? No Does the patient endorse multiple sex partners or anonymous sex partners? No Did the patient have close or sexual contact with a person diagnosed with MPX? No Has the patient traveled outside the [place] where MPX is endemic? No Is there a high clinical suspicion for MPX-- evidenced by one of the following No  -Unlikely to be chickenpox  -Lymphadenopathy  -Rash that present in same phase of evolution on any given body part See flowsheet for further details and programmatic requirements.   Immunization history:   Immunization History  Administered Date(s) Administered   Hepatitis A 10/20/2011   Hepatitis B 07/08/99, 01/11/2000, 07/28/2000   Hpv-Unspecified 10/20/2011   MMR 06/05/2002, 12/25/2003   Meningococcal Mcv4o 10/20/2011   Pneumococcal Conjugate PCV 7 12/25/2003   Tdap 01/26/2011   Varicella 06/05/2002, 10/20/2011     The following portions of the patient's history were reviewed and updated as appropriate: allergies, current medications, past medical history, past social history, past surgical history and problem list.  Objective:  There were no vitals filed for this visit.  Physical Exam Vitals and nursing note reviewed.  Constitutional:      Appearance: Normal appearance.  HENT:     Head: Normocephalic and atraumatic.     Mouth/Throat:     Mouth: Mucous membranes are moist.     Pharynx: Oropharynx is clear. No oropharyngeal exudate or posterior oropharyngeal erythema.  Pulmonary:     Effort: Pulmonary effort is normal.  Abdominal:     General: Abdomen is flat.     Palpations: There is no mass.     Tenderness: There is no abdominal tenderness. There is no rebound.  Genitourinary:    General: Normal vulva.     Exam position: Lithotomy position.     Pubic Area: No rash or pubic lice.      Labia:        Right: No rash or lesion.        Left: No rash or lesion.      Vagina: Vaginal discharge (small amount white homogenous discharge,  pH<4.5) present. No erythema, bleeding or lesions.     Cervix: No cervical motion tenderness, discharge, friability, lesion or erythema.     Uterus: Normal.      Adnexa: Right adnexa normal and left adnexa normal.     Rectum: Normal.  Lymphadenopathy:     Head:     Right side of head: No preauricular or posterior auricular adenopathy.     Left side of head: No preauricular or posterior auricular adenopathy.     Cervical: No cervical adenopathy.     Upper Body:     Right upper body: No supraclavicular, axillary or epitrochlear adenopathy.      Left upper body: No supraclavicular, axillary or epitrochlear adenopathy.     Lower Body: No right inguinal adenopathy. No left inguinal adenopathy.  Skin:    General: Skin is warm and dry.     Findings: No rash.  Neurological:     Mental Status: She is alert and oriented to person, place, and time.      Assessment and Plan:  Michele Nichols is a 22 y.o. female presenting to the [facility] Department for STI screening  1. Screening for STD (sexually transmitted disease) Will contact with positive results Use condoms for all sex  - Chlamydia/Gonorrhea [facility] - HIV [place] [facility] - Syphilis Serology, [facility] - WET PREP FOR TRICH, YEAST, CLUE   Patient accepted all screenings including vaginal CT/GC and bloodwork for HIV/RPR, and wet prep. Patient meets criteria for HepB screening? No. Ordered? no Patient meets criteria for HepC screening? No. Ordered? no  Treat wet prep per standing order Discussed time line for [facility] results and that patient will be called with positive results and encouraged patient to call if she had not heard in 2 weeks.  Counseled to return or seek care for continued or worsening symptoms Recommended repeat testing in 3 months with positive results. Recommended condom use with all sex  Patient is currently using nothing to prevent pregnancy.    Return if symptoms worsen or fail to improve.  No future appointments.  Jerrell Belfast, FNP

## 2022-10-06 DIAGNOSIS — Z419 Encounter for procedure for purposes other than remedying health state, unspecified: Secondary | ICD-10-CM | POA: Diagnosis not present

## 2022-11-06 DIAGNOSIS — Z419 Encounter for procedure for purposes other than remedying health state, unspecified: Secondary | ICD-10-CM | POA: Diagnosis not present

## 2022-11-19 ENCOUNTER — Encounter: Payer: Self-pay | Admitting: Family Medicine

## 2022-11-19 ENCOUNTER — Ambulatory Visit (LOCAL_COMMUNITY_HEALTH_CENTER): Payer: Medicaid Other | Admitting: Family Medicine

## 2022-11-19 VITALS — BP 125/68 | Ht 63.0 in | Wt 239.0 lb

## 2022-11-19 DIAGNOSIS — Z113 Encounter for screening for infections with a predominantly sexual mode of transmission: Secondary | ICD-10-CM

## 2022-11-19 DIAGNOSIS — Z01419 Encounter for gynecological examination (general) (routine) without abnormal findings: Secondary | ICD-10-CM | POA: Diagnosis not present

## 2022-11-19 DIAGNOSIS — Z309 Encounter for contraceptive management, unspecified: Secondary | ICD-10-CM

## 2022-11-19 DIAGNOSIS — F17200 Nicotine dependence, unspecified, uncomplicated: Secondary | ICD-10-CM

## 2022-11-19 DIAGNOSIS — Z3009 Encounter for other general counseling and advice on contraception: Secondary | ICD-10-CM

## 2022-11-19 DIAGNOSIS — Z789 Other specified health status: Secondary | ICD-10-CM

## 2022-11-19 LAB — WET PREP FOR TRICH, YEAST, CLUE
Trichomonas Exam: NEGATIVE
Yeast Exam: NEGATIVE

## 2022-11-19 NOTE — Progress Notes (Signed)
Pt here for family planning visit. Wet prep results reviewed with pt, no treatment required per standing orders.  Family planning reviewed and sent home with pt.   

## 2022-11-19 NOTE — Progress Notes (Signed)
[facility] DEPARTMENT [facility] [address] Main Number: [phone]  Family Planning Visit- Repeat Yearly Visit  Subjective:  Michele Nichols is a 22 y.o. G0P0000  being seen today for an annual wellness visit and to discuss contraception options.   The patient is currently using Pregnant/Seeking Pregnancy for pregnancy prevention. Patient does want a pregnancy in the next year.   Patient has the following medical problems: has Morbid obesity (HCC) 239 lbs.; Smoker 1/2 ppd-1 ppd; and Alcohol use qo day on their problem list.  Chief Complaint  Patient presents with   Contraception    PE   Exposure to STD    Routine screening    Patient reports to clinic for PE and STD testing.  Patient denies concerns about self    See flowsheet for other program required questions.   Body mass index is 42.34 kg/m. - Patient is eligible for diabetes screening based on BMI> 25 and age >35?  no HA1C ordered? not applicable  Patient reports 2 of partners in last year. Desires STI screening?  Yes   Has patient been screened once for HCV in the past?  No  No results found for: "HCVAB"  Does the patient have current of drug use, have a partner with drug use, and/or has been incarcerated since last result? No  If yes-- Screen for HCV through [facility]   Does the patient meet criteria for HBV testing? No  Criteria:  -Household, sexual or needle sharing contact with HBV -History of drug use -HIV positive -Those with known Hep C   Health Maintenance Due  Topic Date Due   COVID-19 Vaccine (1) Never done   HPV VACCINES (2 - 2-dose series) 04/21/2012   CHLAMYDIA SCREENING  Never done   PAP-Cervical Cytology Screening  Never done   PAP SMEAR-Modifier  Never done   DTaP/Tdap/Td (2 - Td or Tdap) 01/25/2021    Review of Systems  Constitutional:  Positive for weight loss.  Eyes:  Negative for blurred vision.  Respiratory:  Negative for cough and  shortness of breath.   Cardiovascular:  Negative for claudication.  Gastrointestinal:  Negative for nausea.  Genitourinary:  Negative for dysuria and frequency.  Skin:  Negative for rash.  Neurological:  Negative for headaches.  Endo/Heme/Allergies:  Does not bruise/bleed easily.  Psychiatric/Behavioral:  Positive for depression.     The following portions of the patient's history were reviewed and updated as appropriate: allergies, current medications, past family history, past medical history, past social history, past surgical history and problem list. Problem list updated.  Objective:   Vitals:   11/19/22 1424  BP: 125/68  Weight: 239 lb (108.4 kg)  Height: 5\' 3"  (1.6 m)    Physical Exam Vitals and nursing note reviewed.  Constitutional:      Appearance: She is obese.  HENT:     Head: Normocephalic and atraumatic.     Mouth/Throat:     Mouth: Mucous membranes are moist.     Pharynx: Oropharynx is clear. No oropharyngeal exudate or posterior oropharyngeal erythema.  Pulmonary:     Effort: Pulmonary effort is normal.  Abdominal:     Palpations: Abdomen is soft. There is no mass.     Tenderness: There is no abdominal tenderness. There is no rebound.  Genitourinary:    General: Normal vulva.     Exam position: Lithotomy position.     Pubic Area: No rash or pubic lice.  Labia:        Right: No rash or lesion.        Left: No rash or lesion.      Vagina: Normal. No vaginal discharge, erythema, bleeding or lesions.     Cervix: No cervical motion tenderness, discharge, friability, lesion or erythema.     Uterus: Normal.      Adnexa: Right adnexa normal and left adnexa normal.     Rectum: Normal.     Comments: pH = 4 Lymphadenopathy:     Head:     Right side of head: No preauricular or posterior auricular adenopathy.     Left side of head: No preauricular or posterior auricular adenopathy.     Cervical: No cervical adenopathy.     Upper Body:     Right upper body:  No supraclavicular, axillary or epitrochlear adenopathy.     Left upper body: No supraclavicular, axillary or epitrochlear adenopathy.     Lower Body: No right inguinal adenopathy. No left inguinal adenopathy.  Skin:    General: Skin is warm and dry.     Findings: No rash.  Neurological:     Mental Status: She is alert and oriented to person, place, and time.       Assessment and Plan:  KEYERRA MENSCHING is a 22 y.o. female G0P0000 presenting to the [facility] Department for an yearly wellness and contraception visit  1. Well woman exam with routine gynecological exam -PHQ-9 score of 13- denies SI -declines counseling -first pap today -marked calf pain on sheet- however states it is actually her knee- states it got better with ibuprofen -CBE not indicated until 25 per ACOG guidelines  - IGP, rfx Aptima HPV ASCU  2. Screening for venereal disease -asymptomatic  - WET PREP FOR TRICH, YEAST, CLUE - Chlamydia/Gonorrhea [facility] [facility] - Gonococcus culture - Gonococcus culture  3. Morbid obesity (HCC) 239 lbs. -has lost about 30# -states it is due to stress  4. Smoker 1/2 ppd-1 ppd -counseled to cut down/quit  5. Alcohol use qo day -has cut down to 2x/week about 15 shots -counseled to cut back -offered counseling- declined -reviewed that this is very important to stop this if she is planning pregnancy  6. Family planning Contraception counseling: Reviewed options based on patient desire and reproductive life plan. Patient is interested in Pregnant/Seeking Pregnancy. This was provided to the patient today.   Risks, benefits, and typical effectiveness rates were reviewed.  Questions were answered.  Written information was also given to the patient to review.    The patient will follow up in  1 years for surveillance.  The patient was told to call with any further questions, or with any concerns about this method of contraception.  Emphasized use of condoms 100% of  the time for STI prevention.  Educated on ECP and assessed need for ECP. Not indicated   Return in about 1 year (around 11/19/2023) for annual well-woman exam.  No future appointments.  Lenice Llamas, [place]

## 2022-11-25 LAB — IGP, RFX APTIMA HPV ASCU: PAP Smear Comment: 0

## 2022-12-06 DIAGNOSIS — Z419 Encounter for procedure for purposes other than remedying health state, unspecified: Secondary | ICD-10-CM | POA: Diagnosis not present

## 2022-12-16 ENCOUNTER — Emergency Department
Admission: EM | Admit: 2022-12-16 | Discharge: 2022-12-16 | Disposition: A | Discharge status: Home / Self Care | Payer: Medicaid Other | Attending: Student in an Organized Health Care Education/Training Program | Admitting: Student in an Organized Health Care Education/Training Program

## 2022-12-16 ENCOUNTER — Other Ambulatory Visit: Payer: Self-pay

## 2022-12-16 ENCOUNTER — Encounter: Payer: Self-pay | Admitting: Intensive Care

## 2022-12-16 DIAGNOSIS — Z20822 Contact with and (suspected) exposure to covid-19: Secondary | ICD-10-CM | POA: Diagnosis not present

## 2022-12-16 DIAGNOSIS — R0981 Nasal congestion: Secondary | ICD-10-CM

## 2022-12-16 DIAGNOSIS — J069 Acute upper respiratory infection, unspecified: Secondary | ICD-10-CM | POA: Insufficient documentation

## 2022-12-16 DIAGNOSIS — F17200 Nicotine dependence, unspecified, uncomplicated: Secondary | ICD-10-CM | POA: Diagnosis not present

## 2022-12-16 LAB — SARS CORONAVIRUS 2 BY RT PCR: SARS Coronavirus 2 by RT PCR: NEGATIVE

## 2022-12-16 LAB — GROUP A STREP BY PCR: Group A Strep by PCR: NOT DETECTED

## 2022-12-16 MED ORDER — ACETAMINOPHEN 325 MG PO TABS
650.0000 mg | ORAL_TABLET | Freq: Once | ORAL | Status: AC
  Administered 2022-12-16: 650 mg via ORAL
  Filled 2022-12-16: qty 2

## 2022-12-16 NOTE — ED Provider Notes (Signed)
[facility] Provider Note    Event Date/Time   First MD Initiated Contact with Patient 12/16/22 0919     (approximate)   History   Headache and Nasal Congestion   HPI  Michele Nichols is a 22 y.o. female with a past medical history of morbid obesity presents today for evaluation of nasal congestion, sore throat that began 2 days ago.  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  No fevers or chills.  No chest pain, shortness of breath, nausea, vomiting, diarrhea, or dysuria. No headache or neck pain.  Patient Active Problem List   Diagnosis Date Noted   Morbid obesity (HCC) 239 lbs. 07/29/2021   Smoker 1/2 ppd-1 ppd 07/29/2021   Alcohol use- 2x/week 15 shots 07/29/2021          Physical Exam   Triage Vital Signs: ED Triage Vitals [12/16/22 0902]  Encounter Vitals Group     BP (!) 137/91     Systolic BP Percentile      Diastolic BP Percentile      Pulse Rate 81     Resp 16     Temp 98.6 F (37 C)     Temp Source Oral     SpO2 97 %     Weight 240 lb (108.9 kg)     Height 5\' 2"  (1.575 m)     Head Circumference      Peak Flow      Pain Score 10     Pain Loc      Pain Education      Exclude from Growth Chart     Most recent vital signs: Vitals:   12/16/22 0902  BP: (!) 137/91  Pulse: 81  Resp: 16  Temp: 98.6 F (37 C)  SpO2: 97%    Physical Exam Vitals and nursing note reviewed.  Constitutional:      General: Awake and alert. No acute distress.    Appearance: Normal appearance. The patient is obese.  HENT:     Head: Normocephalic and atraumatic.     Mouth: Mucous membranes are moist. Uvula midline.  No tonsillar exudate.  No soft palate fluctuance.  No trismus.  No voice change.  No sublingual swelling.  No tender cervical lymphadenopathy.  No nuchal rigidity Nasal congestion Eyes:     General: PERRL. Normal EOMs        Right eye: No discharge.        Left eye: No discharge.      Conjunctiva/sclera: Conjunctivae normal.  Cardiovascular:     Rate and Rhythm: Normal rate and regular rhythm.     Pulses: Normal pulses.  Pulmonary:     Effort: Pulmonary effort is normal. No respiratory distress.     Breath sounds: Normal breath sounds.  Abdominal:     Abdomen is soft. There is no abdominal tenderness. No rebound or guarding. No distention. Musculoskeletal:        General: No swelling. Normal range of motion.     Cervical back: Normal range of motion and neck supple.  Skin:    General: Skin is warm and dry.     Capillary Refill: Capillary refill takes less than 2 seconds.     Findings: No rash.  Neurological:     Mental Status: The patient is awake and alert.      ED Results / Procedures / Treatments   Labs (all labs ordered are listed, but  only abnormal results are displayed) Labs Reviewed  SARS CORONAVIRUS 2 BY RT PCR  GROUP A STREP BY PCR     EKG     RADIOLOGY     PROCEDURES:  Critical Care performed:   Procedures   MEDICATIONS ORDERED IN ED: Medications  acetaminophen (TYLENOL) tablet 650 mg (650 mg Oral Given 12/16/22 1120)     IMPRESSION / MDM / ASSESSMENT AND PLAN / ED COURSE  I reviewed the triage vital signs and the nursing notes.   Differential diagnosis includes, but is not limited to, COVID, flu, RSV, bronchitis, pneumonia, other URI, strep pharyngitis.  Patient is awake and alert, hemodynamically stable and afebrile.  She is nontoxic in appearance.  COVID and strep test obtained in triage are negative.  Symptoms of nasal congestion, dry cough are consistent with viral etiology.  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  She is afebrile, demonstrates no increased work of breathing, and is nontoxic in appearance.  She has normal lung sounds bilaterally, I do not suspect pneumonia.  She is requesting a work note which was provided.  She was treated symptomatically with Tylenol with  improvement of her symptoms.  We discussed return precautions and outpatient follow-up.  Patient understands and agrees with plan.  She was discharged in stable condition.   Patient's presentation is most consistent with acute complicated illness / injury requiring diagnostic workup.     FINAL CLINICAL IMPRESSION(S) / ED DIAGNOSES   Final diagnoses:  Upper respiratory tract infection, unspecified type  Nasal congestion     Rx / DC Orders   ED Discharge Orders     None        Note:  This document was prepared using Dragon voice recognition software and may include unintentional dictation errors.   Keturah Shavers 12/16/22 1433    Willy Eddy, MD 12/16/22 1434

## 2022-12-16 NOTE — ED Triage Notes (Signed)
Pt c/o headache, sore throat, congestion, vomiting, and chills since yesterday.

## 2022-12-16 NOTE — Discharge Instructions (Addendum)
Your COVID and strep test were negative.  You likely have another viral infection.  You may take Tylenol/ibuprofen per package instructions as needed for your symptoms.  Please return for any new, worsening, or change in symptoms or other concerns.  It was a pleasure caring for you today.

## 2022-12-16 NOTE — ED Notes (Signed)
Pt given warm blanket.

## 2022-12-16 NOTE — ED Notes (Signed)
Congestion, head pressure, throat soreness since yesterday. Denies fever and N/V/D today. States did vomit 4 times yesterday. Pt calmly laying on stretcher, skin dry and resp reg/unlabored. Pt states not vomiting today.

## 2022-12-27 NOTE — Addendum Note (Signed)
Addended by: Berdie Ogren on: 12/27/2022 09:50 AM   Modules accepted: Orders

## 2023-01-06 DIAGNOSIS — Z419 Encounter for procedure for purposes other than remedying health state, unspecified: Secondary | ICD-10-CM | POA: Diagnosis not present

## 2023-01-13 ENCOUNTER — Ambulatory Visit: Payer: Medicaid Other

## 2023-02-06 DIAGNOSIS — Z419 Encounter for procedure for purposes other than remedying health state, unspecified: Secondary | ICD-10-CM | POA: Diagnosis not present

## 2023-03-08 DIAGNOSIS — Z419 Encounter for procedure for purposes other than remedying health state, unspecified: Secondary | ICD-10-CM | POA: Diagnosis not present

## 2023-04-08 DIAGNOSIS — Z419 Encounter for procedure for purposes other than remedying health state, unspecified: Secondary | ICD-10-CM | POA: Diagnosis not present

## 2023-04-13 ENCOUNTER — Ambulatory Visit: Payer: Medicaid Other | Admitting: Family Medicine

## 2023-05-08 DIAGNOSIS — Z419 Encounter for procedure for purposes other than remedying health state, unspecified: Secondary | ICD-10-CM | POA: Diagnosis not present

## 2023-05-09 ENCOUNTER — Emergency Department
Admission: EM | Admit: 2023-05-09 | Discharge: 2023-05-09 | Disposition: A | Discharge status: Home / Self Care | Payer: Medicaid Other | Attending: Emergency Medicine | Admitting: Emergency Medicine

## 2023-05-09 ENCOUNTER — Other Ambulatory Visit: Payer: Self-pay

## 2023-05-09 DIAGNOSIS — J020 Streptococcal pharyngitis: Secondary | ICD-10-CM | POA: Insufficient documentation

## 2023-05-09 DIAGNOSIS — J029 Acute pharyngitis, unspecified: Secondary | ICD-10-CM | POA: Diagnosis present

## 2023-05-09 LAB — GROUP A STREP BY PCR: Group A Strep by PCR: DETECTED — AB

## 2023-05-09 MED ORDER — LIDOCAINE VISCOUS HCL 2 % MT SOLN: 5.0000 mL | OROMUCOSAL | 0 refills | Status: AC | PRN

## 2023-05-09 MED ORDER — AMOXICILLIN 875 MG PO TABS: 875.0000 mg | ORAL_TABLET | Freq: Two times a day (BID) | ORAL | 0 refills | Status: AC

## 2023-05-09 NOTE — ED Notes (Signed)
See triage notes. Patient c/o sore throat times two days. Patient rates the pain 10/10

## 2023-05-09 NOTE — ED Triage Notes (Signed)
Sore throat x2 days. Throat is red and covered in white bumps.

## 2023-05-09 NOTE — Discharge Instructions (Addendum)
Follow-up with your primary care provider or urgent care if any continued problems or concerns.  A prescription for amoxicillin 875 twice daily for 10 days and lidocaine solution to swish around in your mouth every 4 hours as needed for mouth pain was sent to the pharmacy.  You may also take Tylenol or ibuprofen as needed for throat pain.  Increase fluids to stay hydrated.  You are also contagious for 24 hours.  After 2 days throw your current toothbrush away and get a new toothbrush as it has the strep germ on it.

## 2023-05-09 NOTE — ED Provider Notes (Signed)
[facility] Provider Note    Event Date/Time   First MD Initiated Contact with Patient 05/09/23 1213     (approximate)   History   Sore Throat   HPI  Michele Nichols is a 22 y.o. female   presents to the ED with complaint of sore throat for the last 2 days.  Patient is unaware of any known exposure to strep.      Physical Exam   Triage Vital Signs: ED Triage Vitals [05/09/23 1148]  Encounter Vitals Group     BP 137/83     Systolic BP Percentile      Diastolic BP Percentile      Pulse Rate 90     Resp 18     Temp 98.8 F (37.1 C)     Temp Source Oral     SpO2 96 %     Weight 230 lb (104.3 kg)     Height 5\' 4"  (1.626 m)     Head Circumference      Peak Flow      Pain Score 10     Pain Loc      Pain Education      Exclude from Growth Chart     Most recent vital signs: Vitals:   05/09/23 1148  BP: 137/83  Pulse: 90  Resp: 18  Temp: 98.8 F (37.1 C)  SpO2: 96%     General: Awake, no distress.  CV:  Good peripheral perfusion.  Resp:  Normal effort.  Abd:  No distention.  Other:  Posterior pharynx erythematous with white exudate present.  Uvula is midline.  Neck supple with minimal bilateral cervical lymphadenopathy.  No muffled speech and patient is able to maintain secretions without any difficulty.   ED Results / Procedures / Treatments   Labs (all labs ordered are listed, but only abnormal results are displayed) Labs Reviewed  GROUP A STREP BY PCR - Abnormal; Notable for the following components:      Result Value   Group A Strep by PCR DETECTED (*)    All other components within normal limits      PROCEDURES:  Critical Care performed:   Procedures   MEDICATIONS ORDERED IN ED: Medications - No data to display   IMPRESSION / MDM / ASSESSMENT AND PLAN / ED COURSE  I reviewed the triage vital signs and the nursing notes.   Differential diagnosis includes, but is not limited to, viral pharyngitis, strep  pharyngitis, uveitis, tonsillitis, peritonsillar abscess.  22 year old female presents to the ED with complaint of sore throat for the last 2 days.  Strep test is positive and patient was made aware that she is contagious for at least 24 hours.  A prescription for amoxicillin 875 twice daily for 10 days and lidocaine viscous to swish around in her mouth every 4 hours as needed for pain.  Patient is encouraged to drink fluids frequently and also continue Tylenol or ibuprofen as needed for throat pain or fever.      Patient's presentation is most consistent with acute complicated illness / injury requiring diagnostic workup.  FINAL CLINICAL IMPRESSION(S) / ED DIAGNOSES   Final diagnoses:  Strep pharyngitis     Rx / DC Orders   ED Discharge Orders          Ordered    amoxicillin (AMOXIL) 875 MG tablet  2 times daily        05/09/23 1231    lidocaine (XYLOCAINE) 2 %  solution  Every 4 hours PRN        05/09/23 1231             Note:  This document was prepared using Dragon voice recognition software and may include unintentional dictation errors.   Tommi Rumps, PA-C 05/09/23 1238    Jene Every, MD 05/09/23 1245

## 2023-06-08 DIAGNOSIS — Z419 Encounter for procedure for purposes other than remedying health state, unspecified: Secondary | ICD-10-CM | POA: Diagnosis not present

## 2023-07-09 DIAGNOSIS — Z419 Encounter for procedure for purposes other than remedying health state, unspecified: Secondary | ICD-10-CM | POA: Diagnosis not present

## 2023-08-06 DIAGNOSIS — Z419 Encounter for procedure for purposes other than remedying health state, unspecified: Secondary | ICD-10-CM | POA: Diagnosis not present

## 2023-09-05 ENCOUNTER — Ambulatory Visit: Admitting: Family Medicine

## 2023-09-06 ENCOUNTER — Other Ambulatory Visit: Payer: Self-pay

## 2023-09-06 ENCOUNTER — Ambulatory Visit: Admitting: Family Medicine

## 2023-09-07 ENCOUNTER — Ambulatory Visit: Admitting: Family Medicine

## 2023-09-17 DIAGNOSIS — Z419 Encounter for procedure for purposes other than remedying health state, unspecified: Secondary | ICD-10-CM | POA: Diagnosis not present

## 2023-09-22 ENCOUNTER — Ambulatory Visit

## 2023-10-17 DIAGNOSIS — Z419 Encounter for procedure for purposes other than remedying health state, unspecified: Secondary | ICD-10-CM | POA: Diagnosis not present

## 2023-10-19 ENCOUNTER — Ambulatory Visit

## 2023-11-17 DIAGNOSIS — Z419 Encounter for procedure for purposes other than remedying health state, unspecified: Secondary | ICD-10-CM | POA: Diagnosis not present

## 2023-12-01 ENCOUNTER — Ambulatory Visit: Admitting: Physician Assistant

## 2023-12-01 DIAGNOSIS — Z113 Encounter for screening for infections with a predominantly sexual mode of transmission: Secondary | ICD-10-CM

## 2023-12-01 LAB — WET PREP FOR TRICH, YEAST, CLUE
Clue Cell Exam: NEGATIVE
Trichomonas Exam: NEGATIVE
Yeast Exam: NEGATIVE

## 2023-12-01 LAB — HM HIV SCREENING LAB: HM HIV Screening: NEGATIVE

## 2023-12-01 NOTE — Progress Notes (Signed)
 Pt is here for STD screening. Wet prep results reviewed with pt, no treatment required per provider. Condoms declined.Wilkie Drought, RN.

## 2023-12-01 NOTE — Progress Notes (Signed)
 [facility] Department STI clinic [address], [address] [place] [address] Main phone: [phone]  STI screening visit  Subjective:  Michele Nichols is a 23 y.o. female being seen today for an STI screening visit. The patient reports they do not have symptoms.  Patient reports that they do not desire a pregnancy in the next year.   They reported they are not interested in discussing contraception today.    No LMP recorded. (Menstrual status: Irregular Periods).  Patient has the following medical conditions:  Patient Active Problem List   Diagnosis Date Noted   Morbid obesity (HCC) 239 lbs. 07/29/2021   Smoker 1/2 ppd-1 ppd 07/29/2021   Alcohol use- 2x/week 15 shots 07/29/2021   Chief Complaint  Patient presents with   SEXUALLY TRANSMITTED DISEASE    Pt is here STD screening and has symptoms    HPI Patient reports she just wants STI screening today.  Does the patient using douching products? No  See flowsheet for further details and programmatic requirements Hyperlink available at the top of the signed note in blue.  Flow sheet content below:  Pregnancy Intention Screening Does the patient want to become pregnant in the next year?: Yes Does the patient's partner want to become pregnant in the next year?: Yes Does the patient currently take folic acid or women's MVI, or a prenatal viitamin?: No Does the patient or their partner want to learn more about planning a healthy pregnancy?: No Would the patient like to discuss contraceptive options today?: No All Patients Anyone smoke around pt and/or pt's children?: Yes Anyone smoke inside pt's house?: Yes Anyone smoke inside car?: Yes Anyone smoke inside the workplace?: No Reason For STD Screen STD Screening: Is asymptomatic Have you ever had an STD?: Yes History of Antibiotic use in the past 2 weeks?: No STD Symptoms Denies all: Yes Risk Factors for Hep B Household, sexual, or needle sharing  contact of a person infected with Hep B: No Sexual contact with a person who uses drugs not as prescribed?: No Currently or Ever used drugs not as prescribed: No HIV Positive: No PRep Patient: No Men who have sex with men: No Have Hepatitis C: No History of Incarceration: No History of Homeslessness?: No Anal sex following anal drug use?: No Risk Factors for Hep C Currently using drugs not as prescribed: No Sexual partner(s) currently using drugs as not prescribed: No History of drug use: No HIV Positive: No People with a history of incarceration: No People born between the years of 37 and 53: No Advise Advised client to quit or stay quit. : Yes Assess # of cigarettes per day: 5 Is patient ready to quit in next 30 days?: No Assist Select: Provided tailored self-help materials Arrange Select: Referred to [facility] [place] (fill out referral form and fax) Abuse History Has patient ever been abused physically?: No Has patient ever been abused sexually?: No Does patient feel they have a problem with Anxiety?: Yes Does patient feel they have a problem with Depression?: Yes Referral to Behavioral Health: No   Screening for MPX risk: Does the patient have an unexplained rash? No Is the patient MSM? No Does the patient endorse multiple sex partners or anonymous sex partners? No Did the patient have close or sexual contact with a person diagnosed with MPX? No Has the patient traveled outside the [place]  where MPX is endemic? No Is there a high clinical suspicion for MPX-- evidenced by one of the following No  -  Unlikely to be chickenpox  -Lymphadenopathy  -Rash that present in same phase of evolution on any given body part  Screenings: Last HIV test per patient/review of record was  Lab Results  Component Value Date   HMHIVSCREEN Negative - Validated 09/09/2022   No results found for: HIV   Last HEPC test per patient/review of record was  Lab Results  Component Value Date    HMHEPCSCREEN Negative-Validated 03/26/2020   No components found for: HEPC   Last HEPB test per patient/review of record was No components found for: HMHEPBSCREEN   Patient reports last pap was:   Lab Results  Component Value Date   SPECADGYN Comment 11/19/2022   Result Date Procedure Results Follow-ups  11/19/2022 IGP, rfx Aptima HPV ASCU DIAGNOSIS:: Comment Specimen adequacy:: Comment Clinician Provided ICD10: Comment Performed by:: Comment QC reviewed by:: Comment PAP Smear Comment: . Note:: Comment Test Methodology: Comment PAP Reflex: Comment     Immunization history:  Immunization History  Administered Date(s) Administered   Hepatitis A 10/20/2011   Hepatitis B 1999/07/18, 01/11/2000, 07/28/2000   Hpv-Unspecified 10/20/2011   MMR 06/05/2002, 12/25/2003   Meningococcal Mcv4o 10/20/2011   Pneumococcal Conjugate PCV 7 12/25/2003   Tdap 01/26/2011   Varicella 06/05/2002, 10/20/2011    The following portions of the patient's history were reviewed and updated as appropriate: allergies, current medications, past medical history, past social history, past surgical history and problem list.  Objective:  There were no vitals filed for this visit.  Physical Exam Vitals and nursing note reviewed. Exam conducted with a chaperone present [employer]).  Constitutional:      Appearance: Normal appearance. She is obese.  HENT:     Head: Normocephalic and atraumatic.     Mouth/Throat:     Mouth: Mucous membranes are moist.     Pharynx: Oropharynx is clear. No oropharyngeal exudate or posterior oropharyngeal erythema.  Pulmonary:     Effort: Pulmonary effort is normal.  Abdominal:     General: Abdomen is flat.     Palpations: There is no mass.     Tenderness: There is no abdominal tenderness. There is no rebound.  Genitourinary:    General: Normal vulva.     Exam position: Lithotomy position.     Pubic Area: No rash or pubic lice.      Labia:        Right: No rash  or lesion.        Left: No rash or lesion.      Vagina: Normal. No vaginal discharge, erythema, bleeding or lesions.     Cervix: No cervical motion tenderness, discharge, friability, lesion or erythema.     Uterus: Normal.      Adnexa: Right adnexa normal and left adnexa normal.     Rectum: Normal.     Comments: pH = 4 Lymphadenopathy:     Head:     Right side of head: No preauricular or posterior auricular adenopathy.     Left side of head: No preauricular or posterior auricular adenopathy.     Cervical: No cervical adenopathy.     Upper Body:     Right upper body: No supraclavicular, axillary or epitrochlear adenopathy.     Left upper body: No supraclavicular, axillary or epitrochlear adenopathy.     Lower Body: No right inguinal adenopathy. No left inguinal adenopathy.   Skin:    General: Skin is warm and dry.     Findings: No rash.   Neurological:  Mental Status: She is alert and oriented to person, place, and time.      Assessment and Plan:  Michele Nichols is a 22 y.o. female presenting to the [facility] Department for STI screening  1. Routine screening for STI (sexually transmitted infection) (Primary) Await STI test results. - Gonococcus culture - Chlamydia/Gonorrhea [place] - HIV [place] [facility] - Syphilis Serology, [place] [facility] - Chlamydia/Gonorrhea [place] [facility] - WET PREP FOR TRICH, YEAST, CLUE   Patient accepted the following screenings: anal CT/GC NAAT swab, oral CT/GC NAAT swab, vaginal CT/GC swab, HIV, and RPR Patient meets criteria for HepB screening? No. Ordered? not applicable Patient meets criteria for HepC screening? No. Ordered? not applicable  Treat wet prep per standing order Discussed time line for [facility] results and that patient will be called with positive results and encouraged patient to call if she had not heard in 2 weeks.  Counseled to return or seek care for continued or worsening symptoms Recommended repeat testing  in 3 months with positive results. Recommended condom use with all sex for STI prevention.   Patient is currently using condoms to prevent pregnancy.  Desires pregnancy. Enc to take daily folic acid 400mg  to reduce risk of fetal ONDT and also to quit smoking.  Return in about 6 months (around 06/01/2024) for STI screening.  No future appointments.  Annia Gomm, PA-C

## 2023-12-06 LAB — GONOCOCCUS CULTURE

## 2023-12-15 ENCOUNTER — Encounter: Payer: Self-pay | Admitting: Physician Assistant

## 2023-12-17 DIAGNOSIS — Z419 Encounter for procedure for purposes other than remedying health state, unspecified: Secondary | ICD-10-CM | POA: Diagnosis not present

## 2024-01-17 DIAGNOSIS — Z419 Encounter for procedure for purposes other than remedying health state, unspecified: Secondary | ICD-10-CM | POA: Diagnosis not present

## 2024-02-17 DIAGNOSIS — Z419 Encounter for procedure for purposes other than remedying health state, unspecified: Secondary | ICD-10-CM | POA: Diagnosis not present

## 2024-03-01 ENCOUNTER — Ambulatory Visit

## 2024-04-02 DIAGNOSIS — Z113 Encounter for screening for infections with a predominantly sexual mode of transmission: Secondary | ICD-10-CM | POA: Diagnosis not present

## 2024-05-16 ENCOUNTER — Other Ambulatory Visit: Payer: Self-pay

## 2024-05-16 ENCOUNTER — Encounter: Payer: Self-pay | Admitting: Emergency Medicine

## 2024-05-16 ENCOUNTER — Emergency Department
Admission: EM | Admit: 2024-05-16 | Discharge: 2024-05-16 | Disposition: A | Discharge status: Home / Self Care | Attending: Emergency Medicine | Admitting: Emergency Medicine

## 2024-05-16 DIAGNOSIS — J020 Streptococcal pharyngitis: Secondary | ICD-10-CM | POA: Insufficient documentation

## 2024-05-16 DIAGNOSIS — J101 Influenza due to other identified influenza virus with other respiratory manifestations: Secondary | ICD-10-CM | POA: Diagnosis not present

## 2024-05-16 DIAGNOSIS — R059 Cough, unspecified: Secondary | ICD-10-CM | POA: Diagnosis present

## 2024-05-16 LAB — GROUP A STREP BY PCR: Group A Strep by PCR: DETECTED — AB

## 2024-05-16 LAB — RESP PANEL BY RT-PCR (RSV, FLU A&B, COVID)  RVPGX2
Influenza A by PCR: POSITIVE — AB
Influenza B by PCR: NEGATIVE
Resp Syncytial Virus by PCR: NEGATIVE
SARS Coronavirus 2 by RT PCR: NEGATIVE

## 2024-05-16 MED ORDER — ONDANSETRON 4 MG PO TBDP
4.0000 mg | ORAL_TABLET | Freq: Once | ORAL | Status: AC
  Administered 2024-05-16: 4 mg via ORAL
  Filled 2024-05-16: qty 1

## 2024-05-16 MED ORDER — CEPHALEXIN 500 MG PO CAPS: 500.0000 mg | ORAL_CAPSULE | Freq: Three times a day (TID) | ORAL | 0 refills | Status: AC

## 2024-05-16 MED ORDER — NAPROXEN 500 MG PO TABS: 500.0000 mg | ORAL_TABLET | Freq: Two times a day (BID) | ORAL | 0 refills | Status: AC

## 2024-05-16 MED ORDER — NAPROXEN 500 MG PO TABS
500.0000 mg | ORAL_TABLET | Freq: Once | ORAL | Status: AC
  Administered 2024-05-16: 500 mg via ORAL
  Filled 2024-05-16: qty 1

## 2024-05-16 NOTE — ED Provider Notes (Signed)
 [facility] Provider Note    Event Date/Time   First MD Initiated Contact with Patient 05/16/24 0710     (approximate)   History   Cough   HPI  Michele Nichols is a 23 y.o. female who comes ED complaining of bodyaches, chills, nonproductive cough for the past 3 days.  Has multiple sick contacts.  Tolerating oral intake.     Physical Exam   Triage Vital Signs: ED Triage Vitals  Encounter Vitals Group     BP 05/16/24 0302 (!) 135/91     Girls Systolic BP Percentile --      Girls Diastolic BP Percentile --      Boys Systolic BP Percentile --      Boys Diastolic BP Percentile --      Pulse Rate 05/16/24 0302 93     Resp 05/16/24 0302 18     Temp 05/16/24 0302 98.5 F (36.9 C)     Temp Source 05/16/24 0302 Oral     SpO2 05/16/24 0302 96 %     Weight 05/16/24 0302 260 lb (117.9 kg)     Height 05/16/24 0302 5' 4 (1.626 m)     Head Circumference --      Peak Flow --      Pain Score 05/16/24 0300 10     Pain Loc --      Pain Education --      Exclude from Growth Chart --     Most recent vital signs: Vitals:   05/16/24 0302 05/16/24 0717  BP: (!) 135/91 (!) 121/56  Pulse: 93 (!) 103  Resp: 18 18  Temp: 98.5 F (36.9 C) 98 F (36.7 C)  SpO2: 96% 98%    General: Awake, no distress.  CV:  Good peripheral perfusion.  Regular rate rhythm Resp:  Normal effort.  Clear lungs Abd:  No distention.  Soft nontender Other:  Nostril mucosa.  Ambulatory.   ED Results / Procedures / Treatments   Labs (all labs ordered are listed, but only abnormal results are displayed) Labs Reviewed  RESP PANEL BY RT-PCR (RSV, FLU A&B, COVID)  RVPGX2 - Abnormal; Notable for the following components:      Result Value   Influenza A by PCR POSITIVE (*)    All other components within normal limits  GROUP A STREP BY PCR - Abnormal; Notable for the following components:   Group A Strep by PCR DETECTED (*)    All other components within normal limits      RADIOLOGY    PROCEDURES:  Procedures   MEDICATIONS ORDERED IN ED: Medications  naproxen  (NAPROSYN ) tablet 500 mg (has no administration in time range)  ondansetron  (ZOFRAN -ODT) disintegrating tablet 4 mg (4 mg Oral Given 05/16/24 0306)     IMPRESSION / MDM / ASSESSMENT AND PLAN / ED COURSE  I reviewed the triage vital signs and the nursing notes.                              Differential diagnosis includes, but is not limited to, COVID, flu, strep throat, RSV  Patient presents with constitutional symptoms, ILI.  RVP is positive for influenza A.  Strep PCR also positive.  May be a chronic carrier, but in shared decision making she opted to start antibiotics now instead of watch and wait.  No sign of PTA or RPA.  She is nontoxic, stable for discharge  FINAL CLINICAL IMPRESSION(S) / ED DIAGNOSES   Final diagnoses:  Influenza A  Strep pharyngitis     Rx / DC Orders   ED Discharge Orders          Ordered    cephALEXin (KEFLEX) 500 MG capsule  3 times daily        05/16/24 0759    naproxen  (NAPROSYN ) 500 MG tablet  2 times daily with meals        05/16/24 0759             Note:  This document was prepared using Dragon voice recognition software and may include unintentional dictation errors.   Viviann Pastor, MD 05/16/24 [phone]

## 2024-05-16 NOTE — ED Triage Notes (Addendum)
 Pt arrived via POV with reports of cough x 3 days will cough so hard it makes her gag, subjective fevers. Generalized body aches  Using Mucinex and theraflu for sxs, + sick contacts.  Sister has strep throat, pt c/o sore throat as well.

## 2024-07-30 ENCOUNTER — Encounter: Admitting: Obstetrics and Gynecology

## 2024-08-02 ENCOUNTER — Encounter: Admitting: Obstetrics and Gynecology
